# Patient Record
Sex: Female | Born: 1950 | Race: White | Hispanic: No | Smoking: Never smoker
Health system: Southern US, Community
[De-identification: ages and names within clinical notes are randomized; demographics above are authoritative.]

## PROBLEM LIST (undated history)

## (undated) DIAGNOSIS — E7800 Pure hypercholesterolemia, unspecified: Secondary | ICD-10-CM

## (undated) DIAGNOSIS — K219 Gastro-esophageal reflux disease without esophagitis: Secondary | ICD-10-CM

## (undated) DIAGNOSIS — I10 Essential (primary) hypertension: Secondary | ICD-10-CM

## (undated) DIAGNOSIS — B977 Papillomavirus as the cause of diseases classified elsewhere: Secondary | ICD-10-CM

## (undated) DIAGNOSIS — M1990 Unspecified osteoarthritis, unspecified site: Secondary | ICD-10-CM

## (undated) HISTORY — PX: BREAST SURGERY: SHX581

## (undated) HISTORY — PX: TUBAL LIGATION: SHX77

## (undated) HISTORY — PX: BREAST EXCISIONAL BIOPSY: SUR124

## (undated) HISTORY — PX: CERVICAL BIOPSY  W/ LOOP ELECTRODE EXCISION: SUR135

## (undated) HISTORY — PX: ABDOMINAL HYSTERECTOMY: SHX81

---

## 1998-05-14 ENCOUNTER — Emergency Department (HOSPITAL_COMMUNITY): Admission: EM | Admit: 1998-05-14 | Discharge: 1998-05-14 | Payer: Self-pay | Admitting: Emergency Medicine

## 1998-05-14 ENCOUNTER — Encounter: Payer: Self-pay | Admitting: Emergency Medicine

## 1999-11-10 ENCOUNTER — Other Ambulatory Visit: Admission: RE | Admit: 1999-11-10 | Discharge: 1999-11-10 | Payer: Self-pay | Admitting: Family Medicine

## 2000-11-15 ENCOUNTER — Other Ambulatory Visit: Admission: RE | Admit: 2000-11-15 | Discharge: 2000-11-15 | Payer: Self-pay | Admitting: Family Medicine

## 2002-06-03 ENCOUNTER — Other Ambulatory Visit: Admission: RE | Admit: 2002-06-03 | Discharge: 2002-06-03 | Payer: Self-pay | Admitting: Family Medicine

## 2003-05-28 ENCOUNTER — Other Ambulatory Visit: Admission: RE | Admit: 2003-05-28 | Discharge: 2003-05-28 | Payer: Self-pay | Admitting: Family Medicine

## 2004-06-07 ENCOUNTER — Other Ambulatory Visit: Admission: RE | Admit: 2004-06-07 | Discharge: 2004-06-07 | Payer: Self-pay | Admitting: Family Medicine

## 2005-09-26 ENCOUNTER — Other Ambulatory Visit: Admission: RE | Admit: 2005-09-26 | Discharge: 2005-09-26 | Payer: Self-pay | Admitting: Family Medicine

## 2006-10-09 ENCOUNTER — Other Ambulatory Visit: Admission: RE | Admit: 2006-10-09 | Discharge: 2006-10-09 | Payer: Self-pay | Admitting: Family Medicine

## 2006-10-23 ENCOUNTER — Emergency Department (HOSPITAL_COMMUNITY): Admission: EM | Admit: 2006-10-23 | Discharge: 2006-10-23 | Payer: Self-pay | Admitting: Emergency Medicine

## 2006-10-30 ENCOUNTER — Other Ambulatory Visit: Admission: RE | Admit: 2006-10-30 | Discharge: 2006-10-30 | Payer: Self-pay | Admitting: Obstetrics and Gynecology

## 2006-12-19 ENCOUNTER — Ambulatory Visit (HOSPITAL_COMMUNITY): Admission: RE | Admit: 2006-12-19 | Discharge: 2006-12-19 | Payer: Self-pay | Admitting: Obstetrics and Gynecology

## 2006-12-19 ENCOUNTER — Encounter (INDEPENDENT_AMBULATORY_CARE_PROVIDER_SITE_OTHER): Payer: Self-pay | Admitting: Obstetrics and Gynecology

## 2007-07-04 ENCOUNTER — Other Ambulatory Visit: Admission: RE | Admit: 2007-07-04 | Discharge: 2007-07-04 | Payer: Self-pay | Admitting: Obstetrics and Gynecology

## 2008-01-29 ENCOUNTER — Other Ambulatory Visit: Admission: RE | Admit: 2008-01-29 | Discharge: 2008-01-29 | Payer: Self-pay | Admitting: Obstetrics and Gynecology

## 2008-08-04 ENCOUNTER — Other Ambulatory Visit: Admission: RE | Admit: 2008-08-04 | Discharge: 2008-08-04 | Payer: Self-pay | Admitting: Obstetrics and Gynecology

## 2008-12-01 ENCOUNTER — Encounter: Admission: RE | Admit: 2008-12-01 | Discharge: 2008-12-01 | Payer: Self-pay | Admitting: Family Medicine

## 2009-02-15 ENCOUNTER — Other Ambulatory Visit: Admission: RE | Admit: 2009-02-15 | Discharge: 2009-02-15 | Payer: Self-pay | Admitting: Obstetrics and Gynecology

## 2009-08-16 ENCOUNTER — Other Ambulatory Visit: Admission: RE | Admit: 2009-08-16 | Discharge: 2009-08-16 | Payer: Self-pay | Admitting: Obstetrics and Gynecology

## 2010-06-04 ENCOUNTER — Encounter: Payer: Self-pay | Admitting: Obstetrics and Gynecology

## 2010-09-26 NOTE — Op Note (Signed)
NAME:  Kayla Bridges, VARBLE NO.:  [id number]   MEDICAL RECORD NO.:  [id number]          PATIENT TYPE:  AMB   LOCATION:  SDC                           FACILITY:  [facility]   PHYSICIAN:  Charles A. Delcambre, MDDATE OF BIRTH:  1951/01/18   DATE OF PROCEDURE:  12/19/2006  DATE OF DISCHARGE:                               OPERATIVE REPORT   PREOPERATIVE DIAGNOSIS:  1. CIN2 of the cervix.  2. Wide vaginal lesion extension that I feel is less than CIN2.   POSTOPERATIVE DIAGNOSIS:  1. CIN2 of the cervix.  2. Wide vaginal lesion extension that I feel is less than CIN2.   PROCEDURE:  1. LEEP resection of 5 o'clock lesion.  2. Laser conization and laser ablation of wide vaginal extensions of      the lesion as seen with Lugol's and the colposcope.   SURGEON:  Charles A. Delcambre, MD   ASSISTANT:  None.   COMPLICATIONS:  None.   ESTIMATED BLOOD LOSS:  None.   ANESTHESIA:  General by the endotracheal route.   SPECIMENS:  5 o'clock LEEP lesion to pathology.   DESCRIPTION OF PROCEDURE:  The patient was taken to the operating room  and placed in the supine position.  General anesthetic was induced  difficulty.  She was then placed in the dorsal lithotomy position and  prepped with moist towels. The colposcope was used.  Lugol's solution  was placed.  I could see, once again, the wide dilation as well as the  earlier 5 o'clock lesion as expected. LEEP was used on 30 cutting, 50  coag, with a medium sized loop, to excise the lesion at 5 o'clock.  Hemostasis was excellent.  The laser was then used to outline the  cervical lesions and the vaginal extensions superiorly and lateral to  the left as well as lateral to her right. These areas were all ablated  to a depth of 2-3 mm with the laser without damage to surrounding  tissue.  The procedure was then terminated.     Charles A. Sydnee Cabal, MD  Electronically Signed    CAD/MEDQ  D:  12/19/2006  T:  12/19/2006  Job:  [id number]

## 2010-09-26 NOTE — H&P (Signed)
NAME:  Kayla Bridges, Kayla Bridges NO.:  [id number]   MEDICAL RECORD NO.:  [id number]          PATIENT TYPE:  AMB   LOCATION:  SDC                           [facility]   PHYSICIAN:  Charles A. Delcambre, MDDATE OF BIRTH:  04/14/1951   DATE OF ADMISSION:  DATE OF DISCHARGE:                              HISTORY & PHYSICAL   This patient is a 60 year old para 1 to be admitted in early August, I  believe August 7th, to undergo laser LEEP to the cervix, specific lesion  at 5 o'clock, and extensive laser to the vagina and cervix otherwise for  a very wide lesion seen on colposcopy.  She has had ongoing Paps,  difficulties.  Last Pap Oct 09, 2006, showed a low grade dysplasia.  Cervical biopsies consistent with what I have stated above, a CIN-2 at  11 o'clock and then a very wide lesion seen on colposcopy; see diagram.   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   PAST MEDICAL HISTORY:  Hypertension.   ALLERGIES:  PRINIVIL CAUSES EDEMA.   SURGERIES:  1. Tubal ligation, 1990.  2. Removal of breast cyst, 1998.  3. Colonoscopy, June, 2005.   MEDICATIONS:  Avapro, hydrochlorothiazide, and baby aspirin daily, doses  not specified.   PCP:  Dr. Volney Presser at [place].   SOCIAL HISTORY:  1. No tobacco, ethanol or drug use.  2. Not married.  3. In a monogamous relationship with her friend, she states.   PAST HISTORY:  Gonorrhea from her ex-husband in years past, 2005 or  2006, leading to divorce.   FAMILY HISTORY:  1. Father hypertension, heart disease and diabetes.  2. Mother arthritis, vasculitis and diabetes.   REVIEW OF SYSTEMS:  No chest pain, shortness of breath, wheezing,  headaches, abdominal pain, incontinence, bowel or bladder changes  otherwise.   PHYSICAL EXAM:  Alert and oriented x3, weight 141, blood pressure  122/74, heart rate 72, respirations 18.  HEENT EXAM:  Grossly within normal limits.  NECK:  Supple without thyromegaly or  adenopathy.  LUNGS:  Clear bilaterally.  HEART:  Regular rate and rhythm without murmur, rub or gallop.  BREASTS:  Deferred to PCP, symmetrical.  ABDOMEN:  Soft, nontender, nondistended, no hepatosplenomegaly.  PELVIC EXAM:  Normal external female genitalia.  Bartholin, urethral,  Skene scan normal.  Vault without discharge or lesions.  See the diagram  for colposcopy of the lesion.  BIMANUAL EXAMINATION:  Uterus not enlarged, adnexa nontender, ovaries  not palpable bilaterally.   ASSESSMENT:  High grade dysplasia of the cervix with a very wide vaginal  lesion.   PLAN:  Colposcopic-guided LEEP at 5 o'clock, where the high grade biopsy  was, and laser and probably further biopsies.  Depending on colposcopy  at the time of surgery, laser to the remaining cervix and to the wider  lesions on the vagina.  She gives informed consent, accepts risks of  damage to bowel and bladder, understands there will be discharge after  surgery for several weeks and we will get a CBC and a CMET  preoperatively.  All questions were  answered and will proceed as  outlined.      Charles A. Sydnee Cabal, MD  Electronically Signed     CAD/MEDQ  D:  12/03/2006  T:  12/03/2006  Job:  [id number]

## 2011-02-26 LAB — COMPREHENSIVE METABOLIC PANEL
ALT: 17
Albumin: 4.3
BUN: 14
Calcium: 9.7
Glucose, Bld: 103 — ABNORMAL HIGH
Sodium: 137
Total Protein: 7.3

## 2011-02-26 LAB — CBC
Hemoglobin: 13.8
MCHC: 34.7
Platelets: 312
RDW: 12.6

## 2011-03-01 LAB — CBC
HCT: 39.1
MCV: 88.3
RBC: 4.43
WBC: 11.4 — ABNORMAL HIGH

## 2011-03-01 LAB — COMPREHENSIVE METABOLIC PANEL
ALT: 25
AST: 36
Alkaline Phosphatase: 96
CO2: 24
Chloride: 97
GFR calc Af Amer: 45 — ABNORMAL LOW
GFR calc non Af Amer: 37 — ABNORMAL LOW
Glucose, Bld: 158 — ABNORMAL HIGH
Potassium: 3.8
Sodium: 131 — ABNORMAL LOW

## 2011-03-01 LAB — URINALYSIS, ROUTINE W REFLEX MICROSCOPIC
Bilirubin Urine: NEGATIVE
Glucose, UA: NEGATIVE
Ketones, ur: NEGATIVE
Protein, ur: NEGATIVE
pH: 6

## 2011-03-01 LAB — DIFFERENTIAL
Basophils Relative: 0
Eosinophils Absolute: 0
Eosinophils Relative: 0
Lymphs Abs: 1.2
Neutrophils Relative %: 80 — ABNORMAL HIGH

## 2011-03-01 LAB — URINE CULTURE: Colony Count: 5000

## 2011-03-01 LAB — URINE MICROSCOPIC-ADD ON

## 2011-03-01 LAB — LIPASE, BLOOD: Lipase: 45

## 2011-07-11 ENCOUNTER — Other Ambulatory Visit (HOSPITAL_COMMUNITY)
Admission: RE | Admit: 2011-07-11 | Discharge: 2011-07-11 | Disposition: A | Discharge status: Home / Self Care | Payer: 59 | Source: Ambulatory Visit | Attending: Family Medicine | Admitting: Family Medicine

## 2011-07-11 ENCOUNTER — Other Ambulatory Visit: Payer: Self-pay | Admitting: Family Medicine

## 2011-07-11 DIAGNOSIS — Z124 Encounter for screening for malignant neoplasm of cervix: Secondary | ICD-10-CM | POA: Insufficient documentation

## 2012-07-16 ENCOUNTER — Other Ambulatory Visit: Payer: Self-pay | Admitting: Family Medicine

## 2012-07-16 ENCOUNTER — Other Ambulatory Visit (HOSPITAL_COMMUNITY)
Admission: RE | Admit: 2012-07-16 | Discharge: 2012-07-16 | Disposition: A | Discharge status: Home / Self Care | Payer: 59 | Source: Ambulatory Visit | Attending: Family Medicine | Admitting: Family Medicine

## 2012-07-16 DIAGNOSIS — Z01419 Encounter for gynecological examination (general) (routine) without abnormal findings: Secondary | ICD-10-CM | POA: Insufficient documentation

## 2014-03-26 ENCOUNTER — Other Ambulatory Visit: Payer: Self-pay | Admitting: Gastroenterology

## 2014-08-09 ENCOUNTER — Encounter (HOSPITAL_COMMUNITY): Payer: Self-pay

## 2014-08-09 NOTE — Patient Instructions (Addendum)
22 Kayla Bridges  08/09/2014   Your procedure is scheduled on:   08-17-2014 Tuesday  Enter through [facility]  Entrance and follow signs to [facility]. Arrive at    1230    PM.  Call this number if you have problems the morning of surgery: [phone]  Or Presurgical Testing [phone].   For Living Will and/or Health Care Power Attorney Forms: please provide copy for your medical record,may bring AM of surgery(Forms should be already notarized -we do not provide this service).(08-10-14  No information preferred today).     Do not eat food/ or drink: After Midnight.  Exception: may have clear liquids:up to 6 Hours before arrival. Nothing after: 0930 AM  Clear liquids include soda, tea, black coffee, apple or grape juice, broth.  Take these medicines the morning of surgery with A SIP OF WATER: Pantoprazole(Protonix).   Do not wear jewelry, make-up or nail polish.  Do not wear deodorant, lotions, powders, or perfumes.   Do not shave legs and under arms- 48 hours(2 days) prior to first CHG shower.(Shaving face and neck okay.)  Do not bring valuables to the hospital.(Hospital is not responsible for lost valuables).  Contacts, dentures or removable bridgework, body piercing, hair pins may not be worn into surgery.  Leave suitcase in the car. After surgery it may be brought to your room.  For patients admitted to the hospital, checkout time is 11:00 AM the day of discharge.(Restricted visitors-Any Persons displaying flu-like symptoms or illness).    Patients discharged the day of surgery will not be allowed to drive home. Must have responsible person with you x 24 hours once discharged.  Name and phone number of your driver: Claud Kelp [phone] cell     Please read over the following fact sheets that you were given:  CHG(Chlorhexidine Gluconate 4% Surgical Soap) use, MRSA Information, Blood Transfusion fact sheet, Incentive Spirometry Instruction.  Remember :  Type/Screen "Blue armbands" - may not be removed once applied(would result in being retested AM of surgery, if removed).         [place] [facility] - Preparing for Surgery Before surgery, you can play an important role.  Because skin is not sterile, your skin needs to be as free of germs as possible.  You can reduce the number of germs on your skin by washing with CHG (chlorahexidine gluconate) soap before surgery.  CHG is an antiseptic cleaner which kills germs and bonds with the skin to continue killing germs even after washing. Please DO NOT use if you have an allergy to CHG or antibacterial soaps.  If your skin becomes reddened/irritated stop using the CHG and inform your nurse when you arrive at Short Stay. Do not shave (including legs and underarms) for at least 48 hours prior to the first CHG shower.  You may shave your face/neck. Please follow these instructions carefully:  1.  Shower with CHG Soap the night before surgery and the  morning of Surgery.  2.  If you choose to wash your hair, wash your hair first as usual with your  normal  shampoo.  3.  After you shampoo, rinse your hair and body thoroughly to remove the  shampoo.                           4.  Use CHG as you would any other liquid soap.  You can apply chg directly  to the skin and wash  Gently with a scrungie or clean washcloth.  5.  Apply the CHG Soap to your body ONLY FROM THE NECK DOWN.   Do not use on face/ open                           Wound or open sores. Avoid contact with eyes, ears mouth and genitals (private parts).                       Wash face,  Genitals (private parts) with your normal soap.             6.  Wash thoroughly, paying special attention to the area where your surgery  will be performed.  7.  Thoroughly rinse your body with warm water from the neck down.  8.  DO NOT shower/wash with your normal soap after using and rinsing off  the CHG Soap.                9.  Pat yourself dry with a  clean towel.            10.  Wear clean pajamas.            11.  Place clean sheets on your bed the night of your first shower and do not  sleep with pets. Day of Surgery : Do not apply any lotions/deodorants the morning of surgery.  Please wear clean clothes to the hospital/surgery center.  FAILURE TO FOLLOW THESE INSTRUCTIONS MAY RESULT IN THE CANCELLATION OF YOUR SURGERY PATIENT SIGNATURE_________________________________  NURSE SIGNATURE__________________________________  ________________________________________________________________________   Rogelia MireIncentive Spirometer  An incentive spirometer is a tool that can help keep your lungs clear and active. This tool measures how well you are filling your lungs with each breath. Taking long deep breaths may help reverse or decrease the chance of developing breathing (pulmonary) problems (especially infection) following:  A long period of time when you are unable to move or be active. BEFORE THE PROCEDURE   If the spirometer includes an indicator to show your best effort, your nurse or respiratory therapist will set it to a desired goal.  If possible, sit up straight or lean slightly forward. Try not to slouch.  Hold the incentive spirometer in an upright position. INSTRUCTIONS FOR USE   Sit on the edge of your bed if possible, or sit up as far as you can in bed or on a chair.  Hold the incentive spirometer in an upright position.  Breathe out normally.  Place the mouthpiece in your mouth and seal your lips tightly around it.  Breathe in slowly and as deeply as possible, raising the piston or the ball toward the top of the column.  Hold your breath for 3-5 seconds or for as long as possible. Allow the piston or ball to fall to the bottom of the column.  Remove the mouthpiece from your mouth and breathe out normally.  Rest for a few seconds and repeat Steps 1 through 7 at least 10 times every 1-2 hours when you are awake. Take your  time and take a few normal breaths between deep breaths.  The spirometer may include an indicator to show your best effort. Use the indicator as a goal to work toward during each repetition.  After each set of 10 deep breaths, practice coughing to be sure your lungs are clear. If you have an incision (the cut made at the time of  surgery), support your incision when coughing by placing a pillow or rolled up towels firmly against it. Once you are able to get out of bed, walk around indoors and cough well. You may stop using the incentive spirometer when instructed by your caregiver.  RISKS AND COMPLICATIONS  Take your time so you do not get dizzy or light-headed.  If you are in pain, you may need to take or ask for pain medication before doing incentive spirometry. It is harder to take a deep breath if you are having pain. AFTER USE  Rest and breathe slowly and easily.  It can be helpful to keep track of a log of your progress. Your caregiver can provide you with a simple table to help with this. If you are using the spirometer at home, follow these instructions: [place] IF:   You are having difficultly using the spirometer.  You have trouble using the spirometer as often as instructed.  Your pain medication is not giving enough relief while using the spirometer.  You develop fever of 100.5 F (38.1 C) or higher. SEEK IMMEDIATE MEDICAL CARE IF:   You cough up bloody sputum that had not been present before.  You develop fever of 102 F (38.9 C) or greater.  You develop worsening pain at or near the incision site. MAKE SURE YOU:   Understand these instructions.  Will watch your condition.  Will get help right away if you are not doing well or get worse. Document Released: 09/10/2006 Document Revised: 07/23/2011 Document Reviewed: 11/11/2006 Exit[employer] Patient Information 2014 [employer],  [place].   ________________________________________________________________________  WHAT IS A BLOOD TRANSFUSION? Blood Transfusion Information  A transfusion is the replacement of blood or some of its parts. Blood is made up of multiple cells which provide different functions.  Red blood cells carry oxygen and are used for blood loss replacement.  White blood cells fight against infection.  Platelets control bleeding.  Plasma helps clot blood.  Other blood products are available for specialized needs, such as hemophilia or other clotting disorders. BEFORE THE TRANSFUSION  Who gives blood for transfusions?   Healthy volunteers who are fully evaluated to make sure their blood is safe. This is blood bank blood. Transfusion therapy is the safest it has ever been in the practice of medicine. Before blood is taken from a donor, a complete history is taken to make sure that person has no history of diseases nor engages in risky social behavior (examples are intravenous drug use or sexual activity with multiple partners). The donor's travel history is screened to minimize risk of transmitting infections, such as malaria. The donated blood is tested for signs of infectious diseases, such as HIV and hepatitis. The blood is then tested to be sure it is compatible with you in order to minimize the chance of a transfusion reaction. If you or a relative donates blood, this is often done in anticipation of surgery and is not appropriate for emergency situations. It takes many days to process the donated blood. RISKS AND COMPLICATIONS Although transfusion therapy is very safe and saves many lives, the main dangers of transfusion include:   Getting an infectious disease.  Developing a transfusion reaction. This is an allergic reaction to something in the blood you were given. Every precaution is taken to prevent this. The decision to have a blood transfusion has been considered carefully by your caregiver  before blood is given. Blood is not given unless the benefits outweigh the risks. AFTER THE  TRANSFUSION  Right after receiving a blood transfusion, you will usually feel much better and more energetic. This is especially true if your red blood cells have gotten low (anemic). The transfusion raises the level of the red blood cells which carry oxygen, and this usually causes an energy increase.  The nurse administering the transfusion will monitor you carefully for complications. HOME CARE INSTRUCTIONS  No special instructions are needed after a transfusion. You may find your energy is better. Speak with your caregiver about any limitations on activity for underlying diseases you may have. SEEK MEDICAL CARE IF:   Your condition is not improving after your transfusion.  You develop redness or irritation at the intravenous (IV) site. SEEK IMMEDIATE MEDICAL CARE IF:  Any of the following symptoms occur over the next 12 hours:  Shaking chills.  You have a temperature by mouth above 102 F (38.9 C), not controlled by medicine.  Chest, back, or muscle pain.  People around you feel you are not acting correctly or are confused.  Shortness of breath or difficulty breathing.  Dizziness and fainting.  You get a rash or develop hives.  You have a decrease in urine output.  Your urine turns a dark color or changes to pink, red, or brown. Any of the following symptoms occur over the next 10 days:  You have a temperature by mouth above 102 F (38.9 C), not controlled by medicine.  Shortness of breath.  Weakness after normal activity.  The white part of the eye turns yellow (jaundice).  You have a decrease in the amount of urine or are urinating less often.  Your urine turns a dark color or changes to pink, red, or brown. Document Released: 04/27/2000 Document Revised: 07/23/2011 Document Reviewed: 12/15/2007 [facility] Patient Information 2014 [place],  [place].  _______________________________________________________________________

## 2014-08-10 ENCOUNTER — Encounter (HOSPITAL_COMMUNITY)
Admission: RE | Admit: 2014-08-10 | Discharge: 2014-08-10 | Disposition: A | Discharge status: Home / Self Care | Payer: 59 | Source: Ambulatory Visit | Attending: Orthopedic Surgery | Admitting: Orthopedic Surgery

## 2014-08-10 ENCOUNTER — Inpatient Hospital Stay (HOSPITAL_COMMUNITY): Admission: RE | Admit: 2014-08-10 | Payer: Self-pay | Source: Ambulatory Visit

## 2014-08-10 ENCOUNTER — Encounter (HOSPITAL_COMMUNITY): Payer: Self-pay

## 2014-08-10 DIAGNOSIS — M1612 Unilateral primary osteoarthritis, left hip: Secondary | ICD-10-CM | POA: Diagnosis not present

## 2014-08-10 DIAGNOSIS — Z01812 Encounter for preprocedural laboratory examination: Secondary | ICD-10-CM | POA: Insufficient documentation

## 2014-08-10 HISTORY — DX: Gastro-esophageal reflux disease without esophagitis: K21.9

## 2014-08-10 HISTORY — DX: Essential (primary) hypertension: I10

## 2014-08-10 HISTORY — DX: Papillomavirus as the cause of diseases classified elsewhere: B97.7

## 2014-08-10 HISTORY — DX: Pure hypercholesterolemia, unspecified: E78.00

## 2014-08-10 HISTORY — DX: Unspecified osteoarthritis, unspecified site: M19.90

## 2014-08-10 LAB — URINALYSIS, ROUTINE W REFLEX MICROSCOPIC
Bilirubin Urine: NEGATIVE
Glucose, UA: NEGATIVE mg/dL
Hgb urine dipstick: NEGATIVE
KETONES UR: NEGATIVE mg/dL
Leukocytes, UA: NEGATIVE
Nitrite: NEGATIVE
PH: 6 (ref 5.0–8.0)
Protein, ur: NEGATIVE mg/dL
Specific Gravity, Urine: 1.007 (ref 1.005–1.030)
Urobilinogen, UA: 0.2 mg/dL (ref 0.0–1.0)

## 2014-08-10 LAB — CBC
HCT: 40.7 % (ref 36.0–46.0)
Hemoglobin: 13.6 g/dL (ref 12.0–15.0)
MCH: 30.6 pg (ref 26.0–34.0)
MCHC: 33.4 g/dL (ref 30.0–36.0)
MCV: 91.7 fL (ref 78.0–100.0)
Platelets: 279 10*3/uL (ref 150–400)
RBC: 4.44 MIL/uL (ref 3.87–5.11)
RDW: 12 % (ref 11.5–15.5)
WBC: 7.2 10*3/uL (ref 4.0–10.5)

## 2014-08-10 LAB — ABO/RH: ABO/RH(D): B POS

## 2014-08-10 LAB — APTT: aPTT: 33 seconds (ref 24–37)

## 2014-08-10 LAB — PROTIME-INR
INR: 0.98 (ref 0.00–1.49)
PROTHROMBIN TIME: 13.1 s (ref 11.6–15.2)

## 2014-08-10 LAB — SURGICAL PCR SCREEN
MRSA, PCR: NEGATIVE
STAPHYLOCOCCUS AUREUS: NEGATIVE

## 2014-08-10 NOTE — Pre-Procedure Instructions (Signed)
08-10-14 EKG 6'15, Labs- CMP, HGB A1C, LP reports with chart. Clearance note( Dr. Hyman HopesWebb) 07-29-14 with chart.

## 2014-08-16 NOTE — H&P (Signed)
TOTAL HIP ADMISSION H&P  Patient is admitted for left total hip arthroplasty.  Subjective:  Chief Complaint:    Left hip primary OA / pain  HPI: Kayla Bridges, 64 y.o. female, has a history of pain and functional disability in the left hip(s) due to arthritis and patient has failed non-surgical conservative treatments for greater than 12 weeks to include NSAID's and/or analgesics and activity modification.  Onset of symptoms was gradual starting ~1 years ago with gradually worsening course since that time.The patient noted no past surgery on the left hip(s).  Patient currently rates pain in the left hip at 6 out of 10 with activity. Patient has worsening of pain with activity and weight bearing, trendelenberg gait, pain that interfers with activities of daily living and pain with passive range of motion. Patient has evidence of periarticular osteophytes and joint space narrowing by imaging studies. This condition presents safety issues increasing the risk of falls. There is no current active infection.  Risks, benefits and expectations were discussed with the patient.  Risks including but not limited to the risk of anesthesia, blood clots, nerve damage, blood vessel damage, failure of the prosthesis, infection and up to and including death.  Patient understand the risks, benefits and expectations and wishes to proceed with surgery.   PCP: Frederich Chick, MD  D/C Plans:      Home with HHPT  Post-op Meds:       No Rx given  Tranexamic Acid:      To be given - IV  Decadron:      Is to be given  FYI:     ASA post-op  Norco post-op    Past Medical History  Diagnosis Date  . Arthritis     osteoarthritis left hip  . Hypertension   . Elevated cholesterol   . HPV in female   . GERD (gastroesophageal reflux disease)     Past Surgical History  Procedure Laterality Date  . Tubal ligation    . Breast surgery      lumpectomy-benign  . Cervical biopsy  w/ loop electrode excision    .  Abdominal hysterectomy      No prescriptions prior to admission   Allergies  Allergen Reactions  . Prinivil [Lisinopril] Swelling and Rash    History  Substance Use Topics  . Smoking status: Never Smoker   . Smokeless tobacco: Not on file  . Alcohol Use: Yes     Comment: wine x2 glasses twice weekly    No family history on file.   Review of Systems  Constitutional: Negative.   HENT: Negative.   Eyes: Negative.   Respiratory: Negative.   Cardiovascular: Negative.   Gastrointestinal: Positive for heartburn and diarrhea.  Genitourinary: Negative.   Musculoskeletal: Positive for joint pain.  Skin: Negative.   Neurological: Negative.   Endo/Heme/Allergies: Negative.   Psychiatric/Behavioral: Negative.     Objective:  Physical Exam  Constitutional: She is oriented to person, place, and time. She appears well-developed and well-nourished.  HENT:  Head: Normocephalic.  Eyes: Pupils are equal, round, and reactive to light.  Neck: Neck supple. No JVD present. No tracheal deviation present. No thyromegaly present.  Cardiovascular: Normal rate, regular rhythm, normal heart sounds and intact distal pulses.   Respiratory: Effort normal and breath sounds normal. No stridor. No respiratory distress. She has no wheezes.  GI: Soft. There is no tenderness. There is no guarding.  Musculoskeletal:       Left hip: She exhibits  decreased range of motion, decreased strength, tenderness and bony tenderness. She exhibits no swelling, no deformity and no laceration.  Lymphadenopathy:    She has no cervical adenopathy.  Neurological: She is alert and oriented to person, place, and time.  Skin: Skin is warm and dry.  Psychiatric: She has a normal mood and affect.      Imaging Review Plain radiographs demonstrate moderate degenerative joint disease of the left hip(s). The bone quality appears to be good for age and reported activity level.  Assessment/Plan:  End stage arthritis, left  hip(s)  The patient history, physical examination, clinical judgement of the provider and imaging studies are consistent with end stage degenerative joint disease of the left hip(s) and total hip arthroplasty is deemed medically necessary. The treatment options including medical management, injection therapy, arthroscopy and arthroplasty were discussed at length. The risks and benefits of total hip arthroplasty were presented and reviewed. The risks due to aseptic loosening, infection, stiffness, dislocation/subluxation,  thromboembolic complications and other imponderables were discussed.  The patient acknowledged the explanation, agreed to proceed with the plan and consent was signed. Patient is being admitted for inpatient treatment for surgery, pain control, PT, OT, prophylactic antibiotics, VTE prophylaxis, progressive ambulation and ADL's and discharge planning.The patient is planning to be discharged home with home health services.     Anastasio AuerbachMatthew S. Corsica Franson   PA-C  08/16/2014, 9:09 AM

## 2014-08-17 ENCOUNTER — Inpatient Hospital Stay (HOSPITAL_COMMUNITY)
Admission: RE | Admit: 2014-08-17 | Discharge: 2014-08-18 | DRG: 470 | Disposition: A | Discharge status: Home Health | Payer: 59 | Source: Ambulatory Visit | Attending: Orthopedic Surgery | Admitting: Orthopedic Surgery

## 2014-08-17 ENCOUNTER — Inpatient Hospital Stay (HOSPITAL_COMMUNITY): Admission: RE | Admit: 2014-08-17 | Payer: 59 | Source: Ambulatory Visit | Admitting: Orthopedic Surgery

## 2014-08-17 ENCOUNTER — Encounter (HOSPITAL_COMMUNITY)
Admission: RE | Disposition: A | Discharge status: Home Health | Payer: Self-pay | Source: Ambulatory Visit | Attending: Orthopedic Surgery

## 2014-08-17 ENCOUNTER — Inpatient Hospital Stay (HOSPITAL_COMMUNITY): Payer: 59 | Admitting: Anesthesiology

## 2014-08-17 ENCOUNTER — Encounter (HOSPITAL_COMMUNITY): Admission: RE | Payer: Self-pay | Source: Ambulatory Visit

## 2014-08-17 ENCOUNTER — Inpatient Hospital Stay (HOSPITAL_COMMUNITY): Payer: 59

## 2014-08-17 ENCOUNTER — Encounter (HOSPITAL_COMMUNITY): Payer: Self-pay | Admitting: Anesthesiology

## 2014-08-17 DIAGNOSIS — E663 Overweight: Secondary | ICD-10-CM | POA: Diagnosis present

## 2014-08-17 DIAGNOSIS — M1612 Unilateral primary osteoarthritis, left hip: Principal | ICD-10-CM | POA: Diagnosis present

## 2014-08-17 DIAGNOSIS — Z6825 Body mass index (BMI) 25.0-25.9, adult: Secondary | ICD-10-CM

## 2014-08-17 DIAGNOSIS — Z96649 Presence of unspecified artificial hip joint: Secondary | ICD-10-CM

## 2014-08-17 DIAGNOSIS — I10 Essential (primary) hypertension: Secondary | ICD-10-CM | POA: Diagnosis present

## 2014-08-17 DIAGNOSIS — Z01812 Encounter for preprocedural laboratory examination: Secondary | ICD-10-CM | POA: Diagnosis not present

## 2014-08-17 DIAGNOSIS — M25552 Pain in left hip: Secondary | ICD-10-CM | POA: Diagnosis present

## 2014-08-17 HISTORY — PX: TOTAL HIP ARTHROPLASTY: SHX124

## 2014-08-17 LAB — TYPE AND SCREEN
ABO/RH(D): B POS
Antibody Screen: NEGATIVE

## 2014-08-17 SURGERY — ARTHROPLASTY, HIP, TOTAL, ANTERIOR APPROACH
Anesthesia: Spinal | Site: Hip | Laterality: Left

## 2014-08-17 MED ORDER — FENTANYL CITRATE 0.05 MG/ML IJ SOLN
INTRAMUSCULAR | Status: DC | PRN
  Administered 2014-08-17 (×2): 50 ug via INTRAVENOUS

## 2014-08-17 MED ORDER — ASPIRIN EC 325 MG PO TBEC
325.0000 mg | DELAYED_RELEASE_TABLET | Freq: Two times a day (BID) | ORAL | Status: DC
  Administered 2014-08-18: 325 mg via ORAL
  Filled 2014-08-17 (×3): qty 1

## 2014-08-17 MED ORDER — SODIUM CHLORIDE 0.9 % IR SOLN
Status: DC | PRN
Start: 2014-08-17 — End: 2014-08-17
  Administered 2014-08-17: 1000 mL

## 2014-08-17 MED ORDER — LACTATED RINGERS IV SOLN
INTRAVENOUS | Status: DC
  Administered 2014-08-17: 16:00:00 via INTRAVENOUS
  Administered 2014-08-17: 1000 mL via INTRAVENOUS

## 2014-08-17 MED ORDER — MAGNESIUM CITRATE PO SOLN: 1.0000 | Freq: Once | ORAL | Status: AC | PRN

## 2014-08-17 MED ORDER — HYDROMORPHONE HCL 1 MG/ML IJ SOLN
INTRAMUSCULAR | Status: AC
  Filled 2014-08-17: qty 1

## 2014-08-17 MED ORDER — BUPIVACAINE HCL (PF) 0.5 % IJ SOLN
INTRAMUSCULAR | Status: DC | PRN
  Administered 2014-08-17: 3 mL

## 2014-08-17 MED ORDER — MENTHOL 3 MG MT LOZG
1.0000 | LOZENGE | OROMUCOSAL | Status: DC | PRN
Start: 2014-08-17 — End: 2014-08-18

## 2014-08-17 MED ORDER — BISACODYL 10 MG RE SUPP
10.0000 mg | Freq: Every day | RECTAL | Status: DC | PRN
Start: 2014-08-17 — End: 2014-08-18

## 2014-08-17 MED ORDER — HYDROMORPHONE HCL 1 MG/ML IJ SOLN
0.2500 mg | INTRAMUSCULAR | Status: DC | PRN
  Administered 2014-08-17: 0.5 mg via INTRAVENOUS

## 2014-08-17 MED ORDER — POTASSIUM CHLORIDE 2 MEQ/ML IV SOLN
100.0000 mL/h | INTRAVENOUS | Status: DC
  Administered 2014-08-17: 100 mL/h via INTRAVENOUS
  Filled 2014-08-17 (×8): qty 1000

## 2014-08-17 MED ORDER — DOCUSATE SODIUM 100 MG PO CAPS
100.0000 mg | ORAL_CAPSULE | Freq: Two times a day (BID) | ORAL | Status: DC
  Administered 2014-08-17 – 2014-08-18 (×2): 100 mg via ORAL

## 2014-08-17 MED ORDER — DEXAMETHASONE SODIUM PHOSPHATE 10 MG/ML IJ SOLN
10.0000 mg | Freq: Once | INTRAMUSCULAR | Status: AC
  Administered 2014-08-18: 10 mg via INTRAVENOUS
  Filled 2014-08-17: qty 1

## 2014-08-17 MED ORDER — ALUM & MAG HYDROXIDE-SIMETH 200-200-20 MG/5ML PO SUSP: 30.0000 mL | ORAL | Status: DC | PRN

## 2014-08-17 MED ORDER — METOCLOPRAMIDE HCL 10 MG PO TABS: 5.0000 mg | ORAL_TABLET | Freq: Three times a day (TID) | ORAL | Status: DC | PRN

## 2014-08-17 MED ORDER — ONDANSETRON HCL 4 MG/2ML IJ SOLN: 4.0000 mg | Freq: Four times a day (QID) | INTRAMUSCULAR | Status: DC | PRN

## 2014-08-17 MED ORDER — CEFAZOLIN SODIUM-DEXTROSE 2-3 GM-% IV SOLR
2.0000 g | INTRAVENOUS | Status: AC
  Administered 2014-08-17: 2 g via INTRAVENOUS

## 2014-08-17 MED ORDER — TRANEXAMIC ACID 100 MG/ML IV SOLN
1000.0000 mg | Freq: Once | INTRAVENOUS | Status: AC
  Administered 2014-08-17: 1000 mg via INTRAVENOUS
  Filled 2014-08-17: qty 10

## 2014-08-17 MED ORDER — PANTOPRAZOLE SODIUM 40 MG PO TBEC
40.0000 mg | DELAYED_RELEASE_TABLET | Freq: Two times a day (BID) | ORAL | Status: DC
  Administered 2014-08-17 – 2014-08-18 (×2): 40 mg via ORAL
  Filled 2014-08-17 (×2): qty 1

## 2014-08-17 MED ORDER — HYDROMORPHONE HCL 1 MG/ML IJ SOLN: 0.5000 mg | INTRAMUSCULAR | Status: DC | PRN

## 2014-08-17 MED ORDER — ONDANSETRON HCL 4 MG/2ML IJ SOLN
INTRAMUSCULAR | Status: DC | PRN
  Administered 2014-08-17: 4 mg via INTRAVENOUS

## 2014-08-17 MED ORDER — DIPHENHYDRAMINE HCL 25 MG PO CAPS
25.0000 mg | ORAL_CAPSULE | Freq: Four times a day (QID) | ORAL | Status: DC | PRN
  Administered 2014-08-18: 25 mg via ORAL
  Filled 2014-08-17: qty 1

## 2014-08-17 MED ORDER — FERROUS SULFATE 325 (65 FE) MG PO TABS
325.0000 mg | ORAL_TABLET | Freq: Three times a day (TID) | ORAL | Status: DC
  Administered 2014-08-18 (×2): 325 mg via ORAL
  Filled 2014-08-17 (×5): qty 1

## 2014-08-17 MED ORDER — PROPOFOL INFUSION 10 MG/ML OPTIME
INTRAVENOUS | Status: DC | PRN
  Administered 2014-08-17: 100 ug/kg/min via INTRAVENOUS

## 2014-08-17 MED ORDER — PHENOL 1.4 % MT LIQD: 1.0000 | OROMUCOSAL | Status: DC | PRN

## 2014-08-17 MED ORDER — DEXAMETHASONE SODIUM PHOSPHATE 10 MG/ML IJ SOLN
INTRAMUSCULAR | Status: DC | PRN
  Administered 2014-08-17: 10 mg via INTRAVENOUS

## 2014-08-17 MED ORDER — CHLORHEXIDINE GLUCONATE 4 % EX LIQD: 60.0000 mL | Freq: Once | CUTANEOUS | Status: DC

## 2014-08-17 MED ORDER — METOCLOPRAMIDE HCL 5 MG/ML IJ SOLN: 5.0000 mg | Freq: Three times a day (TID) | INTRAMUSCULAR | Status: DC | PRN

## 2014-08-17 MED ORDER — METHOCARBAMOL 1000 MG/10ML IJ SOLN
500.0000 mg | Freq: Four times a day (QID) | INTRAMUSCULAR | Status: DC | PRN
  Administered 2014-08-17: 500 mg via INTRAVENOUS
  Filled 2014-08-17 (×2): qty 5

## 2014-08-17 MED ORDER — HYDROCODONE-ACETAMINOPHEN 7.5-325 MG PO TABS
1.0000 | ORAL_TABLET | ORAL | Status: DC
  Administered 2014-08-17: 1 via ORAL
  Administered 2014-08-17 – 2014-08-18 (×3): 2 via ORAL
  Filled 2014-08-17: qty 2
  Filled 2014-08-17: qty 1
  Filled 2014-08-17 (×2): qty 2

## 2014-08-17 MED ORDER — ONDANSETRON HCL 4 MG PO TABS: 4.0000 mg | ORAL_TABLET | Freq: Four times a day (QID) | ORAL | Status: DC | PRN

## 2014-08-17 MED ORDER — CEFAZOLIN SODIUM-DEXTROSE 2-3 GM-% IV SOLR
2.0000 g | Freq: Four times a day (QID) | INTRAVENOUS | Status: AC
  Administered 2014-08-17 – 2014-08-18 (×2): 2 g via INTRAVENOUS
  Filled 2014-08-17 (×2): qty 50

## 2014-08-17 MED ORDER — CELECOXIB 200 MG PO CAPS
200.0000 mg | ORAL_CAPSULE | Freq: Two times a day (BID) | ORAL | Status: DC
  Administered 2014-08-17 – 2014-08-18 (×2): 200 mg via ORAL
  Filled 2014-08-17 (×3): qty 1

## 2014-08-17 MED ORDER — HYDROCHLOROTHIAZIDE 25 MG PO TABS
25.0000 mg | ORAL_TABLET | Freq: Every morning | ORAL | Status: DC
  Administered 2014-08-18: 25 mg via ORAL
  Filled 2014-08-17: qty 1

## 2014-08-17 MED ORDER — PHENYLEPHRINE HCL 10 MG/ML IJ SOLN
INTRAMUSCULAR | Status: DC | PRN
  Administered 2014-08-17 (×3): 80 ug via INTRAVENOUS

## 2014-08-17 MED ORDER — MIDAZOLAM HCL 5 MG/5ML IJ SOLN
INTRAMUSCULAR | Status: DC | PRN
  Administered 2014-08-17: 2 mg via INTRAVENOUS

## 2014-08-17 MED ORDER — IRBESARTAN 150 MG PO TABS
150.0000 mg | ORAL_TABLET | Freq: Every morning | ORAL | Status: DC
  Administered 2014-08-18: 150 mg via ORAL
  Filled 2014-08-17: qty 1

## 2014-08-17 MED ORDER — POLYETHYLENE GLYCOL 3350 17 G PO PACK
17.0000 g | PACK | Freq: Two times a day (BID) | ORAL | Status: DC
  Administered 2014-08-17 – 2014-08-18 (×2): 17 g via ORAL

## 2014-08-17 MED ORDER — METHOCARBAMOL 500 MG PO TABS: 500.0000 mg | ORAL_TABLET | Freq: Four times a day (QID) | ORAL | Status: DC | PRN

## 2014-08-17 MED ORDER — DEXAMETHASONE SODIUM PHOSPHATE 10 MG/ML IJ SOLN: 10.0000 mg | Freq: Once | INTRAMUSCULAR | Status: DC

## 2014-08-17 SURGICAL SUPPLY — 43 items
BAG DECANTER FOR FLEXI CONT (MISCELLANEOUS) IMPLANT
BAG SPEC THK2 15X12 ZIP CLS (MISCELLANEOUS) ×1
BAG ZIPLOCK 12X15 (MISCELLANEOUS) ×2 IMPLANT
CAPT HIP TOTAL 2 ×2 IMPLANT
COVER PERINEAL POST (MISCELLANEOUS) ×2 IMPLANT
DRAPE C-ARM 42X120 X-RAY (DRAPES) ×2 IMPLANT
DRAPE STERI IOBAN 125X83 (DRAPES) ×2 IMPLANT
DRAPE U-SHAPE 47X51 STRL (DRAPES) ×6 IMPLANT
DRSG AQUACEL AG ADV 3.5X10 (GAUZE/BANDAGES/DRESSINGS) ×2 IMPLANT
DURAPREP 26ML APPLICATOR (WOUND CARE) ×2 IMPLANT
ELECT BLADE TIP CTD 4 INCH (ELECTRODE) ×2 IMPLANT
ELECT PENCIL ROCKER SW 15FT (MISCELLANEOUS) ×2 IMPLANT
ELECT REM PT RETURN 15FT ADLT (MISCELLANEOUS) ×2 IMPLANT
ELECT REM PT RETURN 9FT ADLT (ELECTROSURGICAL)
ELECTRODE REM PT RTRN 9FT ADLT (ELECTROSURGICAL) IMPLANT
FACESHIELD WRAPAROUND (MASK) ×8 IMPLANT
GLOVE BIOGEL PI IND STRL 7.5 (GLOVE) ×1 IMPLANT
GLOVE BIOGEL PI IND STRL 8.5 (GLOVE) ×1 IMPLANT
GLOVE BIOGEL PI INDICATOR 7.5 (GLOVE) ×1
GLOVE BIOGEL PI INDICATOR 8.5 (GLOVE) ×1
GLOVE ECLIPSE 8.0 STRL XLNG CF (GLOVE) ×4 IMPLANT
GLOVE ORTHO TXT STRL SZ7.5 (GLOVE) ×2 IMPLANT
GOWN SPEC L3 XXLG W/TWL (GOWN DISPOSABLE) ×2 IMPLANT
GOWN STRL REUS W/TWL LRG LVL3 (GOWN DISPOSABLE) ×2 IMPLANT
HOLDER FOLEY CATH W/STRAP (MISCELLANEOUS) ×2 IMPLANT
KIT BASIN OR (CUSTOM PROCEDURE TRAY) ×2 IMPLANT
LIQUID BAND (GAUZE/BANDAGES/DRESSINGS) ×2 IMPLANT
NDL SAFETY ECLIPSE 18X1.5 (NEEDLE) IMPLANT
NEEDLE HYPO 18GX1.5 SHARP (NEEDLE)
PACK TOTAL JOINT (CUSTOM PROCEDURE TRAY) ×2 IMPLANT
PEN SKIN MARKING BROAD (MISCELLANEOUS) ×2 IMPLANT
SAW OSC TIP CART 19.5X105X1.3 (SAW) ×2 IMPLANT
SUT MNCRL AB 4-0 PS2 18 (SUTURE) ×2 IMPLANT
SUT VIC AB 1 CT1 36 (SUTURE) ×6 IMPLANT
SUT VIC AB 2-0 CT1 27 (SUTURE) ×4
SUT VIC AB 2-0 CT1 TAPERPNT 27 (SUTURE) ×2 IMPLANT
SUT VLOC 180 0 24IN GS25 (SUTURE) ×2 IMPLANT
SYR 50ML LL SCALE MARK (SYRINGE) IMPLANT
TOWEL OR 17X26 10 PK STRL BLUE (TOWEL DISPOSABLE) ×2 IMPLANT
TOWEL OR NON WOVEN STRL DISP B (DISPOSABLE) IMPLANT
TRAY FOLEY CATH 14FRSI W/METER (CATHETERS) ×2 IMPLANT
WATER STERILE IRR 1500ML POUR (IV SOLUTION) ×2 IMPLANT
YANKAUER SUCT BULB TIP 10FT TU (MISCELLANEOUS) ×2 IMPLANT

## 2014-08-17 NOTE — Progress Notes (Signed)
Pt informed me she dropped a can on her toes (Left foot) a few days ago.  Toes are bruised but not swollen.  She is able to wiggle her toes without pain.

## 2014-08-17 NOTE — Op Note (Signed)
NAME:  Kayla Bridges                ACCOUNT NO.: [id number]      MEDICAL RECORD NO.: [id number]      FACILITY[facility]      PHYSICIAN:  Durene RomansLIN,Chawn Spraggins D  DATE OF BIRTH:  12/25/1950     DATE OF PROCEDURE:  08/17/2014                                 OPERATIVE REPORT         PREOPERATIVE DIAGNOSIS: Right  hip osteoarthritis.      POSTOPERATIVE DIAGNOSIS:  Left hip osteoarthritis.      PROCEDURE:  Left total hip replacement through an anterior approach   utilizing DePuy THR system, component size 52mm pinnacle cup, a size 36+4 neutral   Altrex liner, a size 3Hi Tri Lock stem with a 36+1.5 delta ceramic   ball.      SURGEON:  Madlyn FrankelMatthew D. Charlann Boxerlin, M.D.      ASSISTANT:  Lanney GinsMatthew Babish, PA-C      ANESTHESIA:  Spinal.      SPECIMENS:  None.      COMPLICATIONS:  None.      BLOOD LOSS:  150 cc     DRAINS:  None.      INDICATION OF THE PROCEDURE:  Kayla Bridges is a 64 y.o. female who had   presented to office for evaluation of left hip pain.  Radiographs revealed   progressive degenerative changes with bone-on-bone   articulation to the  hip joint.  The patient had painful limited range of   motion significantly affecting their overall quality of life.  The patient was failing to    respond to conservative measures, and at this point was ready   to proceed with more definitive measures.  The patient has noted progressive   degenerative changes in his hip, progressive problems and dysfunction   with regarding the hip prior to surgery.  Consent was obtained for   benefit of pain relief.  Specific risk of infection, DVT, component   failure, dislocation, need for revision surgery, as well discussion of   the anterior versus posterior approach were reviewed.  Consent was   obtained for benefit of anterior pain relief through an anterior   approach.      PROCEDURE IN DETAIL:  The patient was brought to operative theater.   Once adequate anesthesia,  preoperative antibiotics, 2gm of Ancef, 1gm of Tranexamic Acid, and 10mg  of Decadron administered.   The patient was positioned supine on the OSI Hanna table.  Once adequate   padding of boney process was carried out, we had predraped out the hip, and  used fluoroscopy to confirm orientation of the pelvis and position.      The left hip was then prepped and draped from proximal iliac crest to   mid thigh with shower curtain technique.      Time-out was performed identifying the patient, planned procedure, and   extremity.     An incision was then made 2 cm distal and lateral to the   anterior superior iliac spine extending over the orientation of the   tensor fascia lata muscle and sharp dissection was carried down to the   fascia of the muscle and protractor placed in the soft tissues.      The fascia was then incised.  The muscle belly was identified and swept   laterally and retractor placed along the superior neck.  Following   cauterization of the circumflex vessels and removing some pericapsular   fat, a second cobra retractor was placed on the inferior neck.  A third   retractor was placed on the anterior acetabulum after elevating the   anterior rectus.  A L-capsulotomy was along the line of the   superior neck to the trochanteric fossa, then extended proximally and   distally.  Tag sutures were placed and the retractors were then placed   intracapsular.  We then identified the trochanteric fossa and   orientation of my neck cut, confirmed this radiographically   and then made a neck osteotomy with the femur on traction.  The femoral   head was removed without difficulty or complication.  Traction was let   off and retractors were placed posterior and anterior around the   acetabulum.      The labrum and foveal tissue were debrided.  I began reaming with a 47mm   reamer and reamed up to 51mm reamer with good bony bed preparation and a 52mm   cup was chosen.  The final 52mm  Pinnacle cup was then impacted under fluoroscopy  to confirm the depth of penetration and orientation with respect to   abduction.  A screw was placed followed by the hole eliminator.  The final   36+4 neutral Altrex liner was impacted with good visualized rim fit.  The cup was positioned anatomically within the acetabular portion of the pelvis.      At this point, the femur was rolled at 80 degrees.  Further capsule was   released off the inferior aspect of the femoral neck.  I then   released the superior capsule proximally.  The hook was placed laterally   along the femur and elevated manually and held in position with the bed   hook.  The leg was then extended and adducted with the leg rolled to 100   degrees of external rotation.  Once the proximal femur was fully   exposed, I used a box osteotome to set orientation.  I then began   broaching with the starting chili pepper broach and passed this by hand and then broached up to 3.  With the 3 broach in place I chose a high offset neck and did a trial reduction.  The offset was appropriate, leg lengths   appeared to be equal, confirmed radiographically.   Given these findings, I went ahead and dislocated the hip, repositioned all   retractors and positioned the right hip in the extended and abducted position.  The final 3 Hi Tri Lock stem was   chosen and it was impacted down to the level of neck cut.  Based on this   and the trial reduction, a 36+1.5 delta ceramic ball was chosen and   impacted onto a clean and dry trunnion, and the hip was reduced.  The   hip had been irrigated throughout the case again at this point.  I did   reapproximate the superior capsular leaflet to the anterior leaflet   using #1 Vicryl.  The fascia of the   tensor fascia lata muscle was then reapproximated using #1 Vicryl and #0 V-lock sutures .  The   remaining wound was closed with 2-0 Vicryl and running 4-0 Monocryl.   The hip was cleaned, dried, and dressed  sterilely using Dermabond and   Aquacel dressing.  She was then brought   to recovery room in stable condition tolerating the procedure well.    Lanney Gins, PA-C was present for the entirety of the case involved from   preoperative positioning, perioperative retractor management, general   facilitation of the case, as well as primary wound closure as assistant.            Madlyn Frankel Charlann Boxer, M.D.        08/17/2014 4:09 PM

## 2014-08-17 NOTE — Interval H&P Note (Signed)
History and Physical Interval Note:  08/17/2014 1:35 PM  Kayla Bridges  has presented today for surgery, with the diagnosis of LEFT HIP OA  The various methods of treatment have been discussed with the patient and family. After consideration of risks, benefits and other options for treatment, the patient has consented to  Procedure(s): LEFT TOTAL HIP ARTHROPLASTY ANTERIOR APPROACH (Left) as a surgical intervention .  The patient's history has been reviewed, patient examined, no change in status, stable for surgery.  I have reviewed the patient's chart and labs.  Questions were answered to the patient's satisfaction.     Shelda PalLIN,Willella Harding D

## 2014-08-17 NOTE — Anesthesia Preprocedure Evaluation (Addendum)
Anesthesia Evaluation  Patient identified by MRN, date of birth, ID band Patient awake    Reviewed: Allergy & Precautions, NPO status , Patient's Chart, lab work & pertinent test results  Airway Mallampati: II  TM Distance: >3 FB Neck ROM: Full    Dental no notable dental hx.    Pulmonary neg pulmonary ROS,  breath sounds clear to auscultation  Pulmonary exam normal       Cardiovascular hypertension, Pt. on medications Rhythm:Regular Rate:Normal     Neuro/Psych negative neurological ROS  negative psych ROS   GI/Hepatic Neg liver ROS, GERD-  Medicated,  Endo/Other  negative endocrine ROS  Renal/GU negative Renal ROS  negative genitourinary   Musculoskeletal  (+) Arthritis -,   Abdominal   Peds negative pediatric ROS (+)  Hematology negative hematology ROS (+)   Anesthesia Other Findings   Reproductive/Obstetrics negative OB ROS                           Anesthesia Physical Anesthesia Plan  ASA: II  Anesthesia Plan: Spinal   Post-op Pain Management:    Induction: Intravenous  Airway Management Planned:   Additional Equipment:   Intra-op Plan:   Post-operative Plan:   Informed Consent: I have reviewed the patients History and Physical, chart, labs and discussed the procedure including the risks, benefits and alternatives for the proposed anesthesia with the patient or authorized representative who has indicated his/her understanding and acceptance.   Dental advisory given  Plan Discussed with: CRNA  Anesthesia Plan Comments: (Discussed risks and benefits of and differences between spinal and general. Discussed risks of spinal including headache, backache, failure, bleeding, infection, and nerve damage. Patient consents to spinal. Questions answered. Coagulation studies and platelet count acceptable.)        Anesthesia Quick Evaluation

## 2014-08-17 NOTE — Anesthesia Procedure Notes (Signed)
Spinal Patient location during procedure: OR Start time: 08/17/2014 2:40 PM End time: 08/17/2014 2:44 PM Staffing Anesthesiologist: Franne Grip Resident/CRNA: Lajuana Carry E Performed by: resident/CRNA  Preanesthetic Checklist Completed: patient identified, site marked, surgical consent, pre-op evaluation, timeout performed, IV checked, risks and benefits discussed and monitors and equipment checked Spinal Block Patient position: sitting Prep: Betadine Patient monitoring: heart rate, continuous pulse ox and blood pressure Approach: midline Location: L3-4 Injection technique: single-shot Needle Needle type: Sprotte  Needle gauge: 24 G Needle length: 10 cm Assessment Sensory level: T6 Additional Notes Expiration date of kit checked and confirmed. Patient tolerated procedure well, without complications.

## 2014-08-17 NOTE — Transfer of Care (Signed)
Immediate Anesthesia Transfer of Care Note  Patient: Tonia GhentVickie R Kempfer  Procedure(s) Performed: Procedure(s) (LRB): LEFT TOTAL HIP ARTHROPLASTY ANTERIOR APPROACH (Left)  Patient Location: PACU  Anesthesia Type: Spinal  Level of Consciousness: sedated, patient cooperative and responds to stimulation  Airway & Oxygen Therapy: Patient Spontanous Breathing and Patient connected to face mask oxgen  Post-op Assessment: Report given to PACU RN and Post -op Vital signs reviewed and stable  Post vital signs: Reviewed and stable  Complications: No apparent anesthesia complications

## 2014-08-17 NOTE — Anesthesia Postprocedure Evaluation (Signed)
  Anesthesia Post-op Note  Patient: Kayla Bridges  Procedure(s) Performed: Procedure(s) (LRB): LEFT TOTAL HIP ARTHROPLASTY ANTERIOR APPROACH (Left)  Patient Location: PACU  Anesthesia Type: Spinal  Level of Consciousness: awake and alert   Airway and Oxygen Therapy: Patient Spontanous Breathing  Post-op Pain: mild  Post-op Assessment: Post-op Vital signs reviewed, Patient's Cardiovascular Status Stable, Respiratory Function Stable, Patent Airway and No signs of Nausea or vomiting  Last Vitals:  Filed Vitals:   08/17/14 1808  BP:   Pulse: 72  Temp:   Resp: 17    Post-op Vital Signs: stable   Complications: No apparent anesthesia complications

## 2014-08-18 ENCOUNTER — Encounter (HOSPITAL_COMMUNITY): Payer: Self-pay | Admitting: Orthopedic Surgery

## 2014-08-18 DIAGNOSIS — E663 Overweight: Secondary | ICD-10-CM | POA: Diagnosis present

## 2014-08-18 LAB — BASIC METABOLIC PANEL
Anion gap: 9 (ref 5–15)
BUN: 16 mg/dL (ref 6–23)
CO2: 25 mmol/L (ref 19–32)
CREATININE: 0.82 mg/dL (ref 0.50–1.10)
Calcium: 8.4 mg/dL (ref 8.4–10.5)
Chloride: 103 mmol/L (ref 96–112)
GFR calc Af Amer: 87 mL/min — ABNORMAL LOW (ref >90)
GFR calc non Af Amer: 75 mL/min — ABNORMAL LOW (ref >90)
Glucose, Bld: 156 mg/dL — ABNORMAL HIGH (ref 70–99)
POTASSIUM: 3.6 mmol/L (ref 3.5–5.1)
SODIUM: 137 mmol/L (ref 135–145)

## 2014-08-18 LAB — CBC
HCT: 32.1 % — ABNORMAL LOW (ref 36.0–46.0)
Hemoglobin: 10.9 g/dL — ABNORMAL LOW (ref 12.0–15.0)
MCH: 30.9 pg (ref 26.0–34.0)
MCHC: 34 g/dL (ref 30.0–36.0)
MCV: 90.9 fL (ref 78.0–100.0)
Platelets: 242 10*3/uL (ref 150–400)
RBC: 3.53 MIL/uL — ABNORMAL LOW (ref 3.87–5.11)
RDW: 12 % (ref 11.5–15.5)
WBC: 13.7 10*3/uL — AB (ref 4.0–10.5)

## 2014-08-18 MED ORDER — ASPIRIN 325 MG PO TBEC: 325.0000 mg | DELAYED_RELEASE_TABLET | Freq: Two times a day (BID) | ORAL | Status: AC

## 2014-08-18 MED ORDER — DOCUSATE SODIUM 100 MG PO CAPS: 100.0000 mg | ORAL_CAPSULE | Freq: Two times a day (BID) | ORAL | Status: DC

## 2014-08-18 MED ORDER — FERROUS SULFATE 325 (65 FE) MG PO TABS: 325.0000 mg | ORAL_TABLET | Freq: Three times a day (TID) | ORAL | Status: DC

## 2014-08-18 MED ORDER — HYDROCODONE-ACETAMINOPHEN 7.5-325 MG PO TABS: 1.0000 | ORAL_TABLET | ORAL | Status: DC | PRN

## 2014-08-18 MED ORDER — POLYETHYLENE GLYCOL 3350 17 G PO PACK: 17.0000 g | PACK | Freq: Two times a day (BID) | ORAL | Status: DC

## 2014-08-18 MED ORDER — METHOCARBAMOL 500 MG PO TABS: 500.0000 mg | ORAL_TABLET | Freq: Four times a day (QID) | ORAL | Status: DC | PRN

## 2014-08-18 NOTE — Evaluation (Signed)
Occupational Therapy Evaluation Patient Details Name: GINNY LOOMER MRN: [id number] DOB: 07-09-1950 Today's Date: 08/18/2014    History of Present Illness L THR   Clinical Impression   Pt admitted with THA. Education complete and pt will have A at home. Education complete     Follow Up Recommendations  Home health OT    Equipment Recommendations  None recommended by OT;Other (comment) (pt  has a 3 n 1)    Recommendations for Other Services       Precautions / Restrictions Precautions Precautions: Fall Restrictions Weight Bearing Restrictions: No Other Position/Activity Restrictions: WBAT      Mobility Bed Mobility Overal bed mobility: Needs Assistance Bed Mobility: Supine to Sit     Supine to sit: Min assist     General bed mobility comments: pt up in chair  Transfers Overall transfer level: Needs assistance Equipment used: Rolling walker (2 wheeled) Transfers: Sit to/from Stand Sit to Stand: Min guard         General transfer comment: VC for safety         ADL Overall ADL's : Needs assistance/impaired                 Upper Body Dressing : Set up;Sitting   Lower Body Dressing: Minimal assistance;Sit to/from stand   Toilet Transfer: Minimal assistance;BSC   Toileting- Clothing Manipulation and Hygiene: Minimal assistance;Sit to/from stand                         Pertinent Vitals/Pain Pain Assessment: 0-10 Pain Score: 2  Pain Location: l hip Pain Descriptors / Indicators: Sore Pain Intervention(s): Limited activity within patient's tolerance     Hand Dominance Right   Extremity/Trunk Assessment Upper Extremity Assessment Upper Extremity Assessment: Overall WFL for tasks assessed   Lower Extremity Assessment Lower Extremity Assessment: LLE deficits/detail LLE Deficits / Details: hip strength 2+/5 with AAROM at hip to 95 flex and 20 abd   Cervical / Trunk Assessment Cervical / Trunk Assessment: Normal    Communication Communication Communication: No difficulties   Cognition Arousal/Alertness: Awake/alert Behavior During Therapy: WFL for tasks assessed/performed Overall Cognitive Status: Within Functional Limits for tasks assessed                     General Comments       Exercises Exercises: Total Joint     Shoulder Instructions      Home Living Family/patient expects to be discharged to:: Private residence Living Arrangements: Alone Available Help at Discharge: Family Type of Home: House Home Access: Stairs to enter Secretary/administrator of Steps: 2 Entrance Stairs-Rails: Right Home Layout: One level     Bathroom Shower/Tub: Producer, television/film/video: Standard     Home Equipment: None          Prior Functioning/Environment Level of Independence: Independent                      OT Goals(Current goals can be found in the care plan section) Acute Rehab OT Goals Patient Stated Goal: Resume previous lifestyle with decreased pain  OT Frequency:     Barriers to D/C:               End of Session Equipment Utilized During Treatment: Rolling walker Nurse Communication: Mobility status  Activity Tolerance: Patient tolerated treatment well Patient left: in chair   Time: 1210-1224 OT Time Calculation (min): 14 min Charges:  OT General Charges $OT Visit: 1 Procedure OT Evaluation $Initial OT Evaluation Tier I: 1 Procedure G-Codes:    Alba CoryREDDING, Tayshaun Kroh D 08/18/2014, 12:33 PM

## 2014-08-18 NOTE — Evaluation (Signed)
Physical Therapy Evaluation Patient Details Name: Kayla Bridges MRN: [id number] DOB: 1951/02/27 Today's Date: 08/18/2014   History of Present Illness  L THR  Clinical Impression  Pt s/p L THR presents with decreased L LE strength/ROM and post op pain limiting functional mobility.  Pt should progress to dc home with family assist and HHPT follow up.    Follow Up Recommendations Home health PT    Equipment Recommendations  Rolling walker with 5" wheels    Recommendations for Other Services OT consult     Precautions / Restrictions Precautions Precautions: Fall Restrictions Weight Bearing Restrictions: No Other Position/Activity Restrictions: WBAT      Mobility  Bed Mobility Overal bed mobility: Needs Assistance Bed Mobility: Supine to Sit     Supine to sit: Min assist     General bed mobility comments: cues for sequence and use of R LE to self assist  Transfers Overall transfer level: Needs assistance Equipment used: Rolling walker (2 wheeled) Transfers: Sit to/from Stand Sit to Stand: Min assist         General transfer comment: cues for LE management and use of UEs to self assist  Ambulation/Gait Ambulation/Gait assistance: Min assist Ambulation Distance (Feet): 120 Feet Assistive device: Rolling walker (2 wheeled) Gait Pattern/deviations: Step-to pattern;Step-through pattern;Decreased step length - right;Decreased step length - left;Shuffle;Antalgic;Trunk flexed     General Gait Details: cues for sequence, posture and position from [employer]            Wheelchair Mobility    Modified Rankin (Stroke Patients Only)       Balance                                             Pertinent Vitals/Pain Pain Assessment: 0-10 Pain Score: 3  Pain Location: L hip Pain Descriptors / Indicators: Aching;Sore Pain Intervention(s): Limited activity within patient's tolerance;Monitored during session;Premedicated before session;Ice  applied    Home Living Family/patient expects to be discharged to:: Private residence Living Arrangements: Alone Available Help at Discharge: Family Type of Home: House Home Access: Stairs to enter Entrance Stairs-Rails: Right Entrance Stairs-Number of Steps: 2 Home Layout: One level Home Equipment: None      Prior Function Level of Independence: Independent               Hand Dominance   Dominant Hand: Right    Extremity/Trunk Assessment   Upper Extremity Assessment: Overall WFL for tasks assessed           Lower Extremity Assessment: LLE deficits/detail   LLE Deficits / Details: hip strength 2+/5 with AAROM at hip to 95 flex and 20 abd  Cervical / Trunk Assessment: Normal  Communication   Communication: No difficulties  Cognition Arousal/Alertness: Awake/alert Behavior During Therapy: WFL for tasks assessed/performed Overall Cognitive Status: Within Functional Limits for tasks assessed                      General Comments      Exercises Total Joint Exercises Ankle Circles/Pumps: AROM;Both;15 reps;Supine Quad Sets: AROM;Both;10 reps;Supine Heel Slides: AAROM;Left;15 reps;Supine Hip ABduction/ADduction: AROM;Left;10 reps;Supine      Assessment/Plan    PT Assessment Patient needs continued PT services  PT Diagnosis Difficulty walking   PT Problem List Decreased strength;Decreased range of motion;Decreased activity tolerance;Decreased mobility;Decreased knowledge of use of DME;Pain  PT Treatment Interventions  DME instruction;Gait training;Stair training;Functional mobility training;Therapeutic activities;Therapeutic exercise;Patient/family education   PT Goals (Current goals can be found in the Care Plan section) Acute Rehab PT Goals Patient Stated Goal: Resume previous lifestyle with decreased pain PT Goal Formulation: With patient Time For Goal Achievement: 08/25/14 Potential to Achieve Goals: Good    Frequency 7X/week   Barriers to  discharge        Co-evaluation               End of Session Equipment Utilized During Treatment: Gait belt Activity Tolerance: Patient tolerated treatment well Patient left: in chair;with call bell/phone within reach Nurse Communication: Mobility status         Time: 9811-91470936-1003 PT Time Calculation (min) (ACUTE ONLY): 27 min   Charges:   PT Evaluation $Initial PT Evaluation Tier I: 1 Procedure PT Treatments $Therapeutic Exercise: 8-22 mins   PT G Codes:        Zuma Hust 08/18/2014, 10:31 AM

## 2014-08-18 NOTE — Progress Notes (Signed)
     Subjective: 1 Day Post-Op Procedure(s) (LRB): LEFT TOTAL HIP ARTHROPLASTY ANTERIOR APPROACH (Left)   Patient reports pain as mild, pain controlled. No events throughout the night. Ready to be discharged home if she does well with PT and pain stays controlled.  Objective:   VITALS:   Filed Vitals:   08/18/14 0613  BP: 123/66  Pulse: 68  Temp: 98 F (36.7 C)  Resp: 16    Dorsiflexion/Plantar flexion intact Incision: dressing C/D/I No cellulitis present Compartment soft  LABS  Recent Labs  08/18/14 0420  HGB 10.9*  HCT 32.1*  WBC 13.7*  PLT 242     Recent Labs  08/18/14 0420  NA 137  K 3.6  BUN 16  CREATININE 0.82  GLUCOSE 156*     Assessment/Plan: 1 Day Post-Op Procedure(s) (LRB): LEFT TOTAL HIP ARTHROPLASTY ANTERIOR APPROACH (Left) Foley cath d/c'ed Advance diet Up with therapy D/C IV fluids Discharge home with home health [facility] Follow up with OLIN,Lashai Grosch D in 2 weeks.  Contact information:  [facility] [address], [address] [place] [id number] [phone]    Overweight (BMI 25-29.9) Estimated body mass index is 25.29 kg/(m^2) as calculated from the following:   Height as of this encounter: 5\' 5"  (1.651 m).   Weight as of this encounter: 68.947 kg (152 lb). Patient also counseled that weight may inhibit the healing process Patient counseled that losing weight will help with future health issues     Anastasio AuerbachMatthew S. Aram Domzalski   PAC  08/18/2014, 10:03 AM

## 2014-08-18 NOTE — Care Management Note (Addendum)
    Page 1 of 2   08/18/2014     11:57:50 AM CARE MANAGEMENT NOTE 08/18/2014  Patient:  Kayla Bridges,Kayla Bridges   Account Number:  [id number]  Date Initiated:  08/18/2014  Documentation initiated by:  [facility]  Subjective/Objective Assessment:   adm: LEFT TOTAL HIP ARTHROPLASTY ANTERIOR APPROACH (Left)     Action/Plan:   discharge planning   Anticipated DC Date:  08/18/2014   Anticipated DC Plan:  [place]  CM consult      [facility] Choice  HOME HEALTH   Choice offered to / List presented to:  C-1 Patient   DME arranged  3-N-1  Vassie Moselle      DME agency  [facility] arranged  [place]   Status of service:  Completed, signed off Medicare Important Message given?   (If response is "NO", the following Medicare IM given date fields will be blank) Date Medicare IM given:   Medicare IM given by:   Date Additional Medicare IM given:   Additional Medicare IM given by:    Discharge Disposition:  [place]  Per UR Regulation:  Reviewed for med. necessity/level of care/duration of stay  If discussed at [place] of Stay Meetings, dates discussed:    Comments:  08/18/14 08:45 CM met with pt in room to offer choice of home health agency.  Pt chooses [facility] to render HHPT.  Address and contact information verified by pt.  Referral emailed to [employer], Tim.  CM called [facility] DME rep, Colletta [place] to please deliver 3n1 and rolling walker to room prior to discharg.  No other Cm needs were communicated.  Mariane Masters, BSN, CM [phone].

## 2014-08-18 NOTE — Progress Notes (Signed)
Physical Therapy Treatment Patient Details Name: Kayla Bridges MRN: [id number] DOB: 08/02/1950 Today's Date: 08/18/2014    History of Present Illness L THR    PT Comments    Pt progressing well.  Reviewed stairs and car transfers with pt and sister.  Follow Up Recommendations  Home health PT     Equipment Recommendations  Rolling walker with 5" wheels    Recommendations for Other Services OT consult     Precautions / Restrictions Precautions Precautions: Fall Restrictions Weight Bearing Restrictions: No Other Position/Activity Restrictions: WBAT    Mobility  Bed Mobility                  Transfers Overall transfer level: Needs assistance Equipment used: Rolling walker (2 wheeled) Transfers: Sit to/from Stand Sit to Stand: Supervision         General transfer comment: VC for safety  Ambulation/Gait Ambulation/Gait assistance: Min guard;Supervision Ambulation Distance (Feet): 175 Feet Assistive device: Rolling walker (2 wheeled) Gait Pattern/deviations: Step-to pattern;Step-through pattern;Decreased step length - right;Decreased step length - left;Shuffle;Antalgic;Trunk flexed     General Gait Details: cues for sequence, posture and position from RW   Stairs Stairs: Yes Stairs assistance: Min assist Stair Management: One rail Left;Step to pattern;Forwards Number of Stairs: 4 General stair comments: cues for sequence and foot placement, pt utilizing both hands on single rail  Wheelchair Mobility    Modified Rankin (Stroke Patients Only)       Balance                                    Cognition Arousal/Alertness: Awake/alert Behavior During Therapy: WFL for tasks assessed/performed Overall Cognitive Status: Within Functional Limits for tasks assessed                      Exercises      General Comments        Pertinent Vitals/Pain Pain Assessment: 0-10 Pain Score: 3  Pain Location: L hip Pain  Descriptors / Indicators: Aching;Sore Pain Intervention(s): Limited activity within patient's tolerance;Monitored during session;Premedicated before session;Ice applied    Home Living                      Prior Function            PT Goals (current goals can now be found in the care plan section) Acute Rehab PT Goals Patient Stated Goal: Resume previous lifestyle with decreased pain PT Goal Formulation: With patient Time For Goal Achievement: 08/25/14 Potential to Achieve Goals: Good Progress towards PT goals: Progressing toward goals    Frequency  7X/week    PT Plan Current plan remains appropriate    Co-evaluation             End of Session Equipment Utilized During Treatment: Gait belt Activity Tolerance: Patient tolerated treatment well Patient left: in chair;with call bell/phone within reach     Time: 1300-1323 PT Time Calculation (min) (ACUTE ONLY): 23 min  Charges:  $Gait Training: 8-22 mins $Therapeutic Activity: 8-22 mins                    G Codes:      Kayla Bridges 08/18/2014, 5:03 PM

## 2014-08-26 NOTE — Discharge Summary (Signed)
Physician Discharge Summary  Patient ID: Kayla GhentVickie R Schiller MRN: [id number] DOB/AGE: 62/08/1950 62 y.o.  Admit date: 08/17/2014 Discharge date: 08/18/2014   Procedures:  Procedure(s) (LRB): LEFT TOTAL HIP ARTHROPLASTY ANTERIOR APPROACH (Left)  Attending Physician:  Dr. Durene RomansMatthew Olin   Admission Diagnoses:   Left hip primary OA / pain  Discharge Diagnoses:  Principal Problem:   S/P left THA, AA Active Problems:   Overweight (BMI 25.0-29.9)  Past Medical History  Diagnosis Date  . Arthritis     osteoarthritis left hip  . Hypertension   . Elevated cholesterol   . HPV in female   . GERD (gastroesophageal reflux disease)     HPI:    Kayla Bridges, 64 y.o. female, has a history of pain and functional disability in the left hip(s) due to arthritis and patient has failed non-surgical conservative treatments for greater than 12 weeks to include NSAID's and/or analgesics and activity modification. Onset of symptoms was gradual starting ~1 years ago with gradually worsening course since that time.The patient noted no past surgery on the left hip(s). Patient currently rates pain in the left hip at 6 out of 10 with activity. Patient has worsening of pain with activity and weight bearing, trendelenberg gait, pain that interfers with activities of daily living and pain with passive range of motion. Patient has evidence of periarticular osteophytes and joint space narrowing by imaging studies. This condition presents safety issues increasing the risk of falls.There is no current active infection. Risks, benefits and expectations were discussed with the patient. Risks including but not limited to the risk of anesthesia, blood clots, nerve damage, blood vessel damage, failure of the prosthesis, infection and up to and including death. Patient understand the risks, benefits and expectations and wishes to proceed with surgery.   PCP: Kayla Bridges, Kayla D, MD   Discharged Condition: good  Hospital  Course:  Patient underwent the above stated procedure on 08/17/2014. Patient tolerated the procedure well and brought to the recovery room in good condition and subsequently to the floor.  POD #1 BP: 123/66 ; Pulse: 68 ; Temp: 98 F (36.7 C) ; Resp: 16 Patient reports pain as mild, pain controlled. No events throughout the night. Ready to be discharged home. Dorsiflexion/plantar flexion intact, incision: dressing C/Bridges/I, no cellulitis present and compartment soft.   LABS  Basename    HGB  10.9  HCT  32.1    Discharge Exam: General appearance: alert, cooperative and no distress Extremities: Homans sign is negative, no sign of DVT, no edema, redness or tenderness in the calves or thighs and no ulcers, gangrene or trophic changes  Disposition: Home with follow up in 2 weeks   Follow-up Information    Follow up with Shelda PalLIN,Dannel Rafter D, MD. Schedule an appointment as soon as possible for a visit in 2 weeks.   Specialty:  Orthopedic Surgery   Contact information:   [address] [place] [address] [phone]       [facility]   Why:  home health physical therapy   Contact information:   [address] [place] [address] [phone]       Follow up with [facility].   Why:  rolling walker and 3n1 (commode)   Contact information:   [address] [place] [address] [phone]       Discharge Instructions    Call MD / Call 911    Complete by:  As directed   If you experience chest pain or shortness of breath, CALL 911 and be transported to the hospital emergency room.  If you develope a fever above 101 F, pus (white drainage) or increased drainage or redness at the wound, or calf pain, call your surgeon's office.     Change dressing    Complete by:  As directed   Maintain surgical dressing until follow up in the clinic. If the edges start to pull up, may reinforce with tape. If the dressing is  no longer working, may remove and cover with gauze and tape, but must keep the area dry and clean.  Call with any questions or concerns.     Constipation Prevention    Complete by:  As directed   Drink plenty of fluids.  Prune juice may be helpful.  You may use a stool softener, such as Colace (over the counter) 100 mg twice a day.  Use MiraLax (over the counter) for constipation as needed.     Diet - low sodium heart healthy    Complete by:  As directed      Discharge instructions    Complete by:  As directed   Maintain surgical dressing until follow up in the clinic. If the edges start to pull up, may reinforce with tape. If the dressing is no longer working, may remove and cover with gauze and tape, but must keep the area dry and clean.  Follow up in 2 weeks at [facility]. Call with any questions or concerns.     Increase activity slowly as tolerated    Complete by:  As directed      TED hose    Complete by:  As directed   Use stockings (TED hose) for 2 weeks on both leg(s).  You may remove them at night for sleeping.     Weight bearing as tolerated    Complete by:  As directed   Laterality:  left  Extremity:  Lower             Medication List    TAKE these medications        aspirin 325 MG EC tablet  Take 1 tablet (325 mg total) by mouth 2 (two) times daily.     calcium-vitamin Bridges 500-200 MG-UNIT per tablet  Commonly known as:  OSCAL WITH Bridges  Take 1 tablet by mouth daily with breakfast.     cholecalciferol 1000 UNITS tablet  Commonly known as:  VITAMIN Bridges  Take 1,000 Units by mouth daily.     CINNAMON PO  Take 1 tablet by mouth daily.     docusate sodium 100 MG capsule  Commonly known as:  COLACE  Take 1 capsule (100 mg total) by mouth 2 (two) times daily.     ferrous sulfate 325 (65 FE) MG tablet  Take 1 tablet (325 mg total) by mouth 3 (three) times daily after meals.     glucosamine-chondroitin 500-400 MG tablet  Take 1 tablet by mouth daily.      hydrochlorothiazide 25 MG tablet  Commonly known as:  HYDRODIURIL  Take 25 mg by mouth every morning.     HYDROcodone-acetaminophen 7.5-325 MG per tablet  Commonly known as:  NORCO  Take 1-2 tablets by mouth every 4 (four) hours as needed for moderate pain or severe pain.     irbesartan 300 MG tablet  Commonly known as:  AVAPRO  Take 150 mg by mouth every morning.  methocarbamol 500 MG tablet  Commonly known as:  ROBAXIN  Take 1 tablet (500 mg total) by mouth every 6 (six) hours as needed for muscle spasms.     pantoprazole 40 MG tablet  Commonly known as:  PROTONIX  Take 40 mg by mouth 2 (two) times daily.     polyethylene glycol packet  Commonly known as:  MIRALAX / GLYCOLAX  Take 17 g by mouth 2 (two) times daily.     vitamin C 500 MG tablet  Commonly known as:  ASCORBIC ACID  Take 500 mg by mouth daily.         Signed: Anastasio Auerbach. Guiseppe Flanagan   PA-C  08/26/2014, 6:05 PM

## 2015-09-06 ENCOUNTER — Other Ambulatory Visit: Payer: Self-pay | Admitting: Family Medicine

## 2015-09-06 ENCOUNTER — Other Ambulatory Visit (HOSPITAL_COMMUNITY)
Admission: RE | Admit: 2015-09-06 | Discharge: 2015-09-06 | Disposition: A | Discharge status: Home / Self Care | Payer: 59 | Source: Ambulatory Visit | Attending: Family Medicine | Admitting: Family Medicine

## 2015-09-06 DIAGNOSIS — Z01419 Encounter for gynecological examination (general) (routine) without abnormal findings: Secondary | ICD-10-CM | POA: Diagnosis present

## 2015-09-06 DIAGNOSIS — Z1151 Encounter for screening for human papillomavirus (HPV): Secondary | ICD-10-CM | POA: Diagnosis present

## 2015-09-09 LAB — CYTOLOGY - PAP

## 2018-01-21 ENCOUNTER — Other Ambulatory Visit: Payer: Self-pay | Admitting: Orthopedic Surgery

## 2018-01-21 DIAGNOSIS — M1611 Unilateral primary osteoarthritis, right hip: Secondary | ICD-10-CM

## 2018-01-24 ENCOUNTER — Ambulatory Visit
Admission: RE | Admit: 2018-01-24 | Discharge: 2018-01-24 | Disposition: A | Discharge status: Home / Self Care | Payer: Medicare Other | Source: Ambulatory Visit | Attending: Orthopedic Surgery | Admitting: Orthopedic Surgery

## 2018-01-24 DIAGNOSIS — M1611 Unilateral primary osteoarthritis, right hip: Secondary | ICD-10-CM

## 2018-03-28 ENCOUNTER — Other Ambulatory Visit: Payer: Self-pay | Admitting: Family Medicine

## 2018-03-28 DIAGNOSIS — N644 Mastodynia: Secondary | ICD-10-CM

## 2018-04-02 ENCOUNTER — Other Ambulatory Visit: Payer: Self-pay

## 2018-04-02 ENCOUNTER — Other Ambulatory Visit: Payer: Self-pay | Admitting: Family Medicine

## 2018-04-02 DIAGNOSIS — N644 Mastodynia: Secondary | ICD-10-CM

## 2018-04-04 ENCOUNTER — Other Ambulatory Visit: Payer: Medicare Other

## 2018-04-15 ENCOUNTER — Ambulatory Visit: Payer: Medicare Other

## 2018-04-15 ENCOUNTER — Ambulatory Visit
Admission: RE | Admit: 2018-04-15 | Discharge: 2018-04-15 | Disposition: A | Discharge status: Home / Self Care | Payer: Medicare Other | Source: Ambulatory Visit | Attending: Family Medicine | Admitting: Family Medicine

## 2018-04-15 DIAGNOSIS — N644 Mastodynia: Secondary | ICD-10-CM

## 2018-05-13 ENCOUNTER — Ambulatory Visit: Payer: Self-pay | Admitting: Orthopedic Surgery

## 2018-05-16 ENCOUNTER — Ambulatory Visit: Payer: Self-pay | Admitting: Orthopedic Surgery

## 2018-05-16 NOTE — H&P (View-Only) (Signed)
TOTAL HIP ADMISSION H&P  Patient is admitted for right total hip arthroplasty.  Subjective:  Chief Complaint: right hip pain  HPI: Kayla Bridges, 67 y.o. female, has a history of pain and functional disability in the right hip(s) due to arthritis and patient has failed non-surgical conservative treatments for greater than 12 weeks to include NSAID's and/or analgesics, flexibility and strengthening excercises, use of assistive devices, weight reduction as appropriate and activity modification.  Onset of symptoms was gradual starting 5 years ago with gradually worsening course since that time.The patient noted no past surgery on the right hip(s).  Patient currently rates pain in the right hip at 10 out of 10 with activity. Patient has night pain, worsening of pain with activity and weight bearing, trendelenberg gait, pain that interfers with activities of daily living and pain with passive range of motion. Patient has evidence of subchondral cysts, subchondral sclerosis, periarticular osteophytes and joint space narrowing by imaging studies. This condition presents safety issues increasing the risk of falls. There is no current active infection.  Patient Active Problem List   Diagnosis Date Noted  . Overweight (BMI 25.0-29.9) 08/18/2014  . S/P left THA, AA 08/17/2014   Past Medical History:  Diagnosis Date  . Arthritis    osteoarthritis left hip  . Elevated cholesterol   . GERD (gastroesophageal reflux disease)   . HPV in female   . Hypertension     Past Surgical History:  Procedure Laterality Date  . ABDOMINAL HYSTERECTOMY    . BREAST EXCISIONAL BIOPSY Left   . BREAST SURGERY     lumpectomy-benign  . CERVICAL BIOPSY  W/ LOOP ELECTRODE EXCISION    . TOTAL HIP ARTHROPLASTY Left 08/17/2014   Procedure: LEFT TOTAL HIP ARTHROPLASTY ANTERIOR APPROACH;  Surgeon: Matthew Olin, MD;  Location: [facility];  Service: Orthopedics;  Laterality: Left;  . TUBAL LIGATION      Current Outpatient  Medications  Medication Sig Dispense Refill Last Dose  . aspirin EC 81 MG tablet Take 81 mg by mouth daily.     . CALCIUM CARBONATE-VITAMIN D PO Take 1 tablet by mouth daily with breakfast.    Past Week at Unknown time  . Cholecalciferol (VITAMIN D) 50 MCG (2000 UT) CAPS Take 2,000 Units by mouth daily.    Past Week at Unknown time  . docusate sodium (COLACE) 100 MG capsule Take 1 capsule (100 mg total) by mouth 2 (two) times daily. (Patient not taking: Reported on 05/15/2018) 10 capsule 0 Not Taking at Unknown time  . ferrous sulfate 325 (65 FE) MG tablet Take 1 tablet (325 mg total) by mouth 3 (three) times daily after meals. (Patient not taking: Reported on 05/15/2018)  3 Not Taking at Unknown time  . hydrochlorothiazide (HYDRODIURIL) 25 MG tablet Take 25 mg by mouth every morning.   08/17/2014 at 0730  . HYDROcodone-acetaminophen (NORCO) 7.5-325 MG per tablet Take 1-2 tablets by mouth every 4 (four) hours as needed for moderate pain or severe pain. (Patient not taking: Reported on 05/15/2018) 100 tablet 0 Not Taking at Unknown time  . irbesartan (AVAPRO) 300 MG tablet Take 150 mg by mouth every morning.   08/17/2014 at 0730  . methocarbamol (ROBAXIN) 500 MG tablet Take 1 tablet (500 mg total) by mouth every 6 (six) hours as needed for muscle spasms. (Patient not taking: Reported on 05/15/2018) 50 tablet 0 Not Taking at Unknown time  . omeprazole (PRILOSEC) 40 MG capsule Take 40 mg by mouth daily.     .   polyethylene glycol (MIRALAX / GLYCOLAX) packet Take 17 g by mouth 2 (two) times daily. (Patient not taking: Reported on 05/15/2018) 14 each 0 Not Taking at Unknown time  . TURMERIC PO Take 200 mg by mouth daily.     . vitamin B-12 (CYANOCOBALAMIN) 1000 MCG tablet Take 1,000 mcg by mouth daily.      No current facility-administered medications for this visit.    Allergies  Allergen Reactions  . Prinivil [Lisinopril] Swelling and Rash    Social History   Tobacco Use  . Smoking status: Never Smoker   Substance Use Topics  . Alcohol use: Yes    Comment: wine x2 glasses twice weekly    No family history on file.   ROS  Objective:  Physical Exam  Vital signs in last 24 hours: @VSRANGES @  Labs:   Estimated body mass index is 25.29 kg/m as calculated from the following:   Height as of 08/17/14: 5\' 5"  (1.651 m).   Weight as of 08/17/14: 68.9 kg.   Imaging Review Plain radiographs demonstrate severe degenerative joint disease of the right hip(s). The bone quality appears to be adequate for age and reported activity level.    Preoperative templating of the joint replacement has been completed, documented, and submitted to the Operating Room personnel in order to optimize intra-operative equipment management.     Assessment/Plan:  End stage arthritis, right hip(s)  The patient history, physical examination, clinical judgement of the provider and imaging studies are consistent with end stage degenerative joint disease of the right hip(s) and total hip arthroplasty is deemed medically necessary. The treatment options including medical management, injection therapy, arthroscopy and arthroplasty were discussed at length. The risks and benefits of total hip arthroplasty were presented and reviewed. The risks due to aseptic loosening, infection, stiffness, dislocation/subluxation,  thromboembolic complications and other imponderables were discussed.  The patient acknowledged the explanation, agreed to proceed with the plan and consent was signed. Patient is being admitted for inpatient treatment for surgery, pain control, PT, OT, prophylactic antibiotics, VTE prophylaxis, progressive ambulation and ADL's and discharge planning.The patient is planning to be discharged home with HEP. Has DME.

## 2018-05-16 NOTE — H&P (Signed)
TOTAL HIP ADMISSION H&P  Patient is admitted for right total hip arthroplasty.  Subjective:  Chief Complaint: right hip pain  HPI: Kayla Bridges, 68 y.o. female, has a history of pain and functional disability in the right hip(s) due to arthritis and patient has failed non-surgical conservative treatments for greater than 12 weeks to include NSAID's and/or analgesics, flexibility and strengthening excercises, use of assistive devices, weight reduction as appropriate and activity modification.  Onset of symptoms was gradual starting 5 years ago with gradually worsening course since that time.The patient noted no past surgery on the right hip(s).  Patient currently rates pain in the right hip at 10 out of 10 with activity. Patient has night pain, worsening of pain with activity and weight bearing, trendelenberg gait, pain that interfers with activities of daily living and pain with passive range of motion. Patient has evidence of subchondral cysts, subchondral sclerosis, periarticular osteophytes and joint space narrowing by imaging studies. This condition presents safety issues increasing the risk of falls. There is no current active infection.  Patient Active Problem List   Diagnosis Date Noted  . Overweight (BMI 25.0-29.9) 08/18/2014  . S/P left THA, AA 08/17/2014   Past Medical History:  Diagnosis Date  . Arthritis    osteoarthritis left hip  . Elevated cholesterol   . GERD (gastroesophageal reflux disease)   . HPV in female   . Hypertension     Past Surgical History:  Procedure Laterality Date  . ABDOMINAL HYSTERECTOMY    . BREAST EXCISIONAL BIOPSY Left   . BREAST SURGERY     lumpectomy-benign  . CERVICAL BIOPSY  W/ LOOP ELECTRODE EXCISION    . TOTAL HIP ARTHROPLASTY Left 08/17/2014   Procedure: LEFT TOTAL HIP ARTHROPLASTY ANTERIOR APPROACH;  Surgeon: Durene Romans, MD;  Location: [facility];  Service: Orthopedics;  Laterality: Left;  . TUBAL LIGATION      Current Outpatient  Medications  Medication Sig Dispense Refill Last Dose  . aspirin EC 81 MG tablet Take 81 mg by mouth daily.     Marland Kitchen CALCIUM CARBONATE-VITAMIN D PO Take 1 tablet by mouth daily with breakfast.    Past Week at Unknown time  . Cholecalciferol (VITAMIN D) 50 MCG (2000 UT) CAPS Take 2,000 Units by mouth daily.    Past Week at Unknown time  . docusate sodium (COLACE) 100 MG capsule Take 1 capsule (100 mg total) by mouth 2 (two) times daily. (Patient not taking: Reported on 05/15/2018) 10 capsule 0 Not Taking at Unknown time  . ferrous sulfate 325 (65 FE) MG tablet Take 1 tablet (325 mg total) by mouth 3 (three) times daily after meals. (Patient not taking: Reported on 05/15/2018)  3 Not Taking at Unknown time  . hydrochlorothiazide (HYDRODIURIL) 25 MG tablet Take 25 mg by mouth every morning.   08/17/2014 at 0730  . HYDROcodone-acetaminophen (NORCO) 7.5-325 MG per tablet Take 1-2 tablets by mouth every 4 (four) hours as needed for moderate pain or severe pain. (Patient not taking: Reported on 05/15/2018) 100 tablet 0 Not Taking at Unknown time  . irbesartan (AVAPRO) 300 MG tablet Take 150 mg by mouth every morning.   08/17/2014 at 0730  . methocarbamol (ROBAXIN) 500 MG tablet Take 1 tablet (500 mg total) by mouth every 6 (six) hours as needed for muscle spasms. (Patient not taking: Reported on 05/15/2018) 50 tablet 0 Not Taking at Unknown time  . omeprazole (PRILOSEC) 40 MG capsule Take 40 mg by mouth daily.     Marland Kitchen  polyethylene glycol (MIRALAX / GLYCOLAX) packet Take 17 g by mouth 2 (two) times daily. (Patient not taking: Reported on 05/15/2018) 14 each 0 Not Taking at Unknown time  . TURMERIC PO Take 200 mg by mouth daily.     . vitamin B-12 (CYANOCOBALAMIN) 1000 MCG tablet Take 1,000 mcg by mouth daily.      No current facility-administered medications for this visit.    Allergies  Allergen Reactions  . Prinivil [Lisinopril] Swelling and Rash    Social History   Tobacco Use  . Smoking status: Never Smoker   Substance Use Topics  . Alcohol use: Yes    Comment: wine x2 glasses twice weekly    No family history on file.   ROS  Objective:  Physical Exam  Vital signs in last 24 hours: @VSRANGES @  Labs:   Estimated body mass index is 25.29 kg/m as calculated from the following:   Height as of 08/17/14: 5\' 5"  (1.651 m).   Weight as of 08/17/14: 68.9 kg.   Imaging Review Plain radiographs demonstrate severe degenerative joint disease of the right hip(s). The bone quality appears to be adequate for age and reported activity level.    Preoperative templating of the joint replacement has been completed, documented, and submitted to the Operating Room personnel in order to optimize intra-operative equipment management.     Assessment/Plan:  End stage arthritis, right hip(s)  The patient history, physical examination, clinical judgement of the provider and imaging studies are consistent with end stage degenerative joint disease of the right hip(s) and total hip arthroplasty is deemed medically necessary. The treatment options including medical management, injection therapy, arthroscopy and arthroplasty were discussed at length. The risks and benefits of total hip arthroplasty were presented and reviewed. The risks due to aseptic loosening, infection, stiffness, dislocation/subluxation,  thromboembolic complications and other imponderables were discussed.  The patient acknowledged the explanation, agreed to proceed with the plan and consent was signed. Patient is being admitted for inpatient treatment for surgery, pain control, PT, OT, prophylactic antibiotics, VTE prophylaxis, progressive ambulation and ADL's and discharge planning.The patient is planning to be discharged home with HEP. Has DME.

## 2018-05-21 NOTE — Patient Instructions (Signed)
Kayla Bridges  05/21/2018   Your procedure is scheduled on: 05-29-2018    Report to [facility] Main  Entrance     Report to admitting at 8:00AM    Call this number if you have problems the morning of surgery [phone]      Remember: Do not eat food or drink liquids :After Midnight. BRUSH YOUR TEETH MORNING OF SURGERY AND RINSE YOUR MOUTH OUT, NO CHEWING GUM CANDY OR MINTS.     Take these medicines the morning of surgery with A SIP OF WATER: OMEPRAZOLE                                 You may not have any metal on your body including hair pins and              piercings  Do not wear jewelry, make-up, lotions, powders or perfumes, deodorant             Do not wear nail polish.  Do not shave  48 hours prior to surgery.               Do not bring valuables to the hospital. [place] IS NOT             RESPONSIBLE   FOR VALUABLES.  Contacts, dentures or bridgework may not be worn into surgery.  Leave suitcase in the car. After surgery it may be brought to your room.                   Please read over the following fact sheets you were given: _____________________________________________________________________             [facility] - Preparing for Surgery Before surgery, you can play an important role.  Because skin is not sterile, your skin needs to be as free of germs as possible.  You can reduce the number of germs on your skin by washing with CHG (chlorahexidine gluconate) soap before surgery.  CHG is an antiseptic cleaner which kills germs and bonds with the skin to continue killing germs even after washing. Please DO NOT use if you have an allergy to CHG or antibacterial soaps.  If your skin becomes reddened/irritated stop using the CHG and inform your nurse when you arrive at Short Stay. Do not shave (including legs and underarms) for at least 48 hours prior to the first CHG shower.  You may shave your face/neck. Please follow these  instructions carefully:  1.  Shower with CHG Soap the night before surgery and the  morning of Surgery.  2.  If you choose to wash your hair, wash your hair first as usual with your  normal  shampoo.  3.  After you shampoo, rinse your hair and body thoroughly to remove the  shampoo.                           4.  Use CHG as you would any other liquid soap.  You can apply chg directly  to the skin and wash                       Gently with a scrungie or clean washcloth.  5.  Apply the CHG Soap to your body ONLY FROM THE  NECK DOWN.   Do not use on face/ open                           Wound or open sores. Avoid contact with eyes, ears mouth and genitals (private parts).                       Wash face,  Genitals (private parts) with your normal soap.             6.  Wash thoroughly, paying special attention to the area where your surgery  will be performed.  7.  Thoroughly rinse your body with warm water from the neck down.  8.  DO NOT shower/wash with your normal soap after using and rinsing off  the CHG Soap.                9.  Pat yourself dry with a clean towel.            10.  Wear clean pajamas.            11.  Place clean sheets on your bed the night of your first shower and do not  sleep with pets. Day of Surgery : Do not apply any lotions/deodorants the morning of surgery.  Please wear clean clothes to the hospital/surgery center.  FAILURE TO FOLLOW THESE INSTRUCTIONS MAY RESULT IN THE CANCELLATION OF YOUR SURGERY PATIENT SIGNATURE_________________________________  NURSE SIGNATURE__________________________________  ________________________________________________________________________   Adam Phenix  An incentive spirometer is a tool that can help keep your lungs clear and active. This tool measures how well you are filling your lungs with each breath. Taking long deep breaths may help reverse or decrease the chance of developing breathing (pulmonary) problems (especially  infection) following:  A long period of time when you are unable to move or be active. BEFORE THE PROCEDURE   If the spirometer includes an indicator to show your best effort, your nurse or respiratory therapist will set it to a desired goal.  If possible, sit up straight or lean slightly forward. Try not to slouch.  Hold the incentive spirometer in an upright position. INSTRUCTIONS FOR USE  1. Sit on the edge of your bed if possible, or sit up as far as you can in bed or on a chair. 2. Hold the incentive spirometer in an upright position. 3. Breathe out normally. 4. Place the mouthpiece in your mouth and seal your lips tightly around it. 5. Breathe in slowly and as deeply as possible, raising the piston or the ball toward the top of the column. 6. Hold your breath for 3-5 seconds or for as long as possible. Allow the piston or ball to fall to the bottom of the column. 7. Remove the mouthpiece from your mouth and breathe out normally. 8. Rest for a few seconds and repeat Steps 1 through 7 at least 10 times every 1-2 hours when you are awake. Take your time and take a few normal breaths between deep breaths. 9. The spirometer may include an indicator to show your best effort. Use the indicator as a goal to work toward during each repetition. 10. After each set of 10 deep breaths, practice coughing to be sure your lungs are clear. If you have an incision (the cut made at the time of surgery), support your incision when coughing by placing a pillow or rolled up towels firmly against it. Once you are able  to get out of bed, walk around indoors and cough well. You may stop using the incentive spirometer when instructed by your caregiver.  RISKS AND COMPLICATIONS  Take your time so you do not get dizzy or light-headed.  If you are in pain, you may need to take or ask for pain medication before doing incentive spirometry. It is harder to take a deep breath if you are having pain. AFTER  USE  Rest and breathe slowly and easily.  It can be helpful to keep track of a log of your progress. Your caregiver can provide you with a simple table to help with this. If you are using the spirometer at home, follow these instructions: [place] IF:   You are having difficultly using the spirometer.  You have trouble using the spirometer as often as instructed.  Your pain medication is not giving enough relief while using the spirometer.  You develop fever of 100.5 F (38.1 C) or higher. SEEK IMMEDIATE MEDICAL CARE IF:   You cough up bloody sputum that had not been present before.  You develop fever of 102 F (38.9 C) or greater.  You develop worsening pain at or near the incision site. MAKE SURE YOU:   Understand these instructions.  Will watch your condition.  Will get help right away if you are not doing well or get worse. Document Released: 09/10/2006 Document Revised: 07/23/2011 Document Reviewed: 11/11/2006 Exit[employer] Patient Information 2014 Exit[employer], [place].   ________________________________________________________________________  WHAT IS A BLOOD TRANSFUSION? Blood Transfusion Information  A transfusion is the replacement of blood or some of its parts. Blood is made up of multiple cells which provide different functions.  Red blood cells carry oxygen and are used for blood loss replacement.  White blood cells fight against infection.  Platelets control bleeding.  Plasma helps clot blood.  Other blood products are available for specialized needs, such as hemophilia or other clotting disorders. BEFORE THE TRANSFUSION  Who gives blood for transfusions?   Healthy volunteers who are fully evaluated to make sure their blood is safe. This is blood bank blood. Transfusion therapy is the safest it has ever been in the practice of medicine. Before blood is taken from a donor, a complete history is taken to make sure that person has no history of diseases  nor engages in risky social behavior (examples are intravenous drug use or sexual activity with multiple partners). The donor's travel history is screened to minimize risk of transmitting infections, such as malaria. The donated blood is tested for signs of infectious diseases, such as HIV and hepatitis. The blood is then tested to be sure it is compatible with you in order to minimize the chance of a transfusion reaction. If you or a relative donates blood, this is often done in anticipation of surgery and is not appropriate for emergency situations. It takes many days to process the donated blood. RISKS AND COMPLICATIONS Although transfusion therapy is very safe and saves many lives, the main dangers of transfusion include:   Getting an infectious disease.  Developing a transfusion reaction. This is an allergic reaction to something in the blood you were given. Every precaution is taken to prevent this. The decision to have a blood transfusion has been considered carefully by your caregiver before blood is given. Blood is not given unless the benefits outweigh the risks. AFTER THE TRANSFUSION  Right after receiving a blood transfusion, you will usually feel much better and more energetic. This is especially true  if your red blood cells have gotten low (anemic). The transfusion raises the level of the red blood cells which carry oxygen, and this usually causes an energy increase.  The nurse administering the transfusion will monitor you carefully for complications. HOME CARE INSTRUCTIONS  No special instructions are needed after a transfusion. You may find your energy is better. Speak with your caregiver about any limitations on activity for underlying diseases you may have. SEEK MEDICAL CARE IF:   Your condition is not improving after your transfusion.  You develop redness or irritation at the intravenous (IV) site. SEEK IMMEDIATE MEDICAL CARE IF:  Any of the following symptoms occur over the  next 12 hours:  Shaking chills.  You have a temperature by mouth above 102 F (38.9 C), not controlled by medicine.  Chest, back, or muscle pain.  People around you feel you are not acting correctly or are confused.  Shortness of breath or difficulty breathing.  Dizziness and fainting.  You get a rash or develop hives.  You have a decrease in urine output.  Your urine turns a dark color or changes to pink, red, or brown. Any of the following symptoms occur over the next 10 days:  You have a temperature by mouth above 102 F (38.9 C), not controlled by medicine.  Shortness of breath.  Weakness after normal activity.  The white part of the eye turns yellow (jaundice).  You have a decrease in the amount of urine or are urinating less often.  Your urine turns a dark color or changes to pink, red, or brown. Document Released: 04/27/2000 Document Revised: 07/23/2011 Document Reviewed: 12/15/2007 [facility] Patient Information 2014 [place], [place].  _______________________________________________________________________

## 2018-05-22 ENCOUNTER — Encounter (HOSPITAL_COMMUNITY)
Admission: RE | Admit: 2018-05-22 | Discharge: 2018-05-22 | Disposition: A | Discharge status: Home / Self Care | Payer: Medicare Other | Source: Ambulatory Visit | Attending: Orthopedic Surgery | Admitting: Orthopedic Surgery

## 2018-05-22 ENCOUNTER — Encounter (HOSPITAL_COMMUNITY): Payer: Self-pay

## 2018-05-22 ENCOUNTER — Other Ambulatory Visit: Payer: Self-pay

## 2018-05-22 DIAGNOSIS — M1611 Unilateral primary osteoarthritis, right hip: Secondary | ICD-10-CM | POA: Diagnosis not present

## 2018-05-22 DIAGNOSIS — Z01812 Encounter for preprocedural laboratory examination: Secondary | ICD-10-CM | POA: Diagnosis present

## 2018-05-22 LAB — CBC
HCT: 42.2 % (ref 36.0–46.0)
Hemoglobin: 13.8 g/dL (ref 12.0–15.0)
MCH: 31 pg (ref 26.0–34.0)
MCHC: 32.7 g/dL (ref 30.0–36.0)
MCV: 94.8 fL (ref 80.0–100.0)
Platelets: 300 10*3/uL (ref 150–400)
RBC: 4.45 MIL/uL (ref 3.87–5.11)
RDW: 11.5 % (ref 11.5–15.5)
WBC: 10 10*3/uL (ref 4.0–10.5)
nRBC: 0 % (ref 0.0–0.2)

## 2018-05-22 LAB — SURGICAL PCR SCREEN
MRSA, PCR: NEGATIVE
Staphylococcus aureus: NEGATIVE

## 2018-05-22 LAB — BASIC METABOLIC PANEL
Anion gap: 9 (ref 5–15)
BUN: 20 mg/dL (ref 8–23)
CALCIUM: 9.8 mg/dL (ref 8.9–10.3)
CO2: 29 mmol/L (ref 22–32)
Chloride: 102 mmol/L (ref 98–111)
Creatinine, Ser: 0.72 mg/dL (ref 0.44–1.00)
GFR calc Af Amer: >60 mL/min (ref >60)
GFR calc non Af Amer: >60 mL/min (ref >60)
Glucose, Bld: 107 mg/dL — ABNORMAL HIGH (ref 70–99)
Potassium: 4 mmol/L (ref 3.5–5.1)
Sodium: 140 mmol/L (ref 135–145)

## 2018-05-22 NOTE — Progress Notes (Signed)
Medical clearance Dr Shirlean Mylar on chart   EKG, [id number] 04-22-18 on chart [facility] physicians

## 2018-05-29 ENCOUNTER — Encounter (HOSPITAL_COMMUNITY)
Admission: RE | Disposition: A | Discharge status: Home / Self Care | Payer: Self-pay | Source: Home / Self Care | Attending: Orthopedic Surgery

## 2018-05-29 ENCOUNTER — Other Ambulatory Visit: Payer: Self-pay

## 2018-05-29 ENCOUNTER — Inpatient Hospital Stay (HOSPITAL_COMMUNITY)
Admission: RE | Admit: 2018-05-29 | Discharge: 2018-05-30 | DRG: 470 | Disposition: A | Discharge status: Home / Self Care | Payer: Medicare Other | Attending: Orthopedic Surgery | Admitting: Orthopedic Surgery

## 2018-05-29 ENCOUNTER — Inpatient Hospital Stay (HOSPITAL_COMMUNITY): Payer: Medicare Other

## 2018-05-29 ENCOUNTER — Inpatient Hospital Stay (HOSPITAL_COMMUNITY): Payer: Medicare Other | Admitting: Certified Registered Nurse Anesthetist

## 2018-05-29 ENCOUNTER — Encounter (HOSPITAL_COMMUNITY): Payer: Self-pay | Admitting: Certified Registered Nurse Anesthetist

## 2018-05-29 ENCOUNTER — Inpatient Hospital Stay (HOSPITAL_COMMUNITY): Payer: Medicare Other | Admitting: Physician Assistant

## 2018-05-29 DIAGNOSIS — Z888 Allergy status to other drugs, medicaments and biological substances status: Secondary | ICD-10-CM

## 2018-05-29 DIAGNOSIS — Z6826 Body mass index (BMI) 26.0-26.9, adult: Secondary | ICD-10-CM

## 2018-05-29 DIAGNOSIS — Z79899 Other long term (current) drug therapy: Secondary | ICD-10-CM

## 2018-05-29 DIAGNOSIS — E663 Overweight: Secondary | ICD-10-CM | POA: Diagnosis present

## 2018-05-29 DIAGNOSIS — Z96642 Presence of left artificial hip joint: Secondary | ICD-10-CM | POA: Diagnosis present

## 2018-05-29 DIAGNOSIS — Z7982 Long term (current) use of aspirin: Secondary | ICD-10-CM

## 2018-05-29 DIAGNOSIS — K219 Gastro-esophageal reflux disease without esophagitis: Secondary | ICD-10-CM | POA: Diagnosis present

## 2018-05-29 DIAGNOSIS — M1611 Unilateral primary osteoarthritis, right hip: Secondary | ICD-10-CM | POA: Diagnosis present

## 2018-05-29 DIAGNOSIS — Z90710 Acquired absence of both cervix and uterus: Secondary | ICD-10-CM

## 2018-05-29 DIAGNOSIS — E7800 Pure hypercholesterolemia, unspecified: Secondary | ICD-10-CM | POA: Diagnosis present

## 2018-05-29 DIAGNOSIS — I10 Essential (primary) hypertension: Secondary | ICD-10-CM | POA: Diagnosis present

## 2018-05-29 DIAGNOSIS — M25751 Osteophyte, right hip: Secondary | ICD-10-CM | POA: Diagnosis present

## 2018-05-29 DIAGNOSIS — Z09 Encounter for follow-up examination after completed treatment for conditions other than malignant neoplasm: Secondary | ICD-10-CM

## 2018-05-29 DIAGNOSIS — Z419 Encounter for procedure for purposes other than remedying health state, unspecified: Secondary | ICD-10-CM

## 2018-05-29 HISTORY — PX: TOTAL HIP ARTHROPLASTY: SHX124

## 2018-05-29 LAB — TYPE AND SCREEN
ABO/RH(D): B POS
ANTIBODY SCREEN: NEGATIVE

## 2018-05-29 SURGERY — ARTHROPLASTY, HIP, TOTAL, ANTERIOR APPROACH
Anesthesia: Spinal | Laterality: Right

## 2018-05-29 MED ORDER — DEXAMETHASONE SODIUM PHOSPHATE 10 MG/ML IJ SOLN
INTRAMUSCULAR | Status: AC
  Filled 2018-05-29: qty 1

## 2018-05-29 MED ORDER — PROPOFOL 10 MG/ML IV BOLUS
INTRAVENOUS | Status: DC | PRN
  Administered 2018-05-29: 20 mg via INTRAVENOUS

## 2018-05-29 MED ORDER — LIDOCAINE 2% (20 MG/ML) 5 ML SYRINGE
INTRAMUSCULAR | Status: AC
  Filled 2018-05-29: qty 5

## 2018-05-29 MED ORDER — CHLORHEXIDINE GLUCONATE 4 % EX LIQD: 60.0000 mL | Freq: Once | CUTANEOUS | Status: DC

## 2018-05-29 MED ORDER — KETOROLAC TROMETHAMINE 30 MG/ML IJ SOLN
INTRAMUSCULAR | Status: DC | PRN
  Administered 2018-05-29: 30 mg

## 2018-05-29 MED ORDER — HYDROCODONE-ACETAMINOPHEN 7.5-325 MG PO TABS: 1.0000 | ORAL_TABLET | ORAL | Status: DC | PRN

## 2018-05-29 MED ORDER — MIDAZOLAM HCL 2 MG/2ML IJ SOLN
INTRAMUSCULAR | Status: AC
  Filled 2018-05-29: qty 2

## 2018-05-29 MED ORDER — ALUM & MAG HYDROXIDE-SIMETH 200-200-20 MG/5ML PO SUSP: 30.0000 mL | ORAL | Status: DC | PRN

## 2018-05-29 MED ORDER — ONDANSETRON HCL 4 MG/2ML IJ SOLN
INTRAMUSCULAR | Status: AC
  Filled 2018-05-29: qty 2

## 2018-05-29 MED ORDER — ISOPROPYL ALCOHOL 70 % SOLN
Status: DC | PRN
  Administered 2018-05-29: 1 via TOPICAL

## 2018-05-29 MED ORDER — SODIUM CHLORIDE 0.9 % IV SOLN
INTRAVENOUS | Status: DC
  Administered 2018-05-29 – 2018-05-30 (×3): via INTRAVENOUS

## 2018-05-29 MED ORDER — CEFAZOLIN SODIUM-DEXTROSE 2-4 GM/100ML-% IV SOLN
2.0000 g | Freq: Four times a day (QID) | INTRAVENOUS | Status: AC
  Administered 2018-05-29 (×2): 2 g via INTRAVENOUS
  Filled 2018-05-29 (×2): qty 100

## 2018-05-29 MED ORDER — ACETAMINOPHEN 325 MG PO TABS: 325.0000 mg | ORAL_TABLET | Freq: Four times a day (QID) | ORAL | Status: DC | PRN

## 2018-05-29 MED ORDER — PHENOL 1.4 % MT LIQD: 1.0000 | OROMUCOSAL | Status: DC | PRN

## 2018-05-29 MED ORDER — FENTANYL CITRATE (PF) 100 MCG/2ML IJ SOLN
INTRAMUSCULAR | Status: DC | PRN
  Administered 2018-05-29 (×2): 50 ug via INTRAVENOUS

## 2018-05-29 MED ORDER — VITAMIN D 25 MCG (1000 UNIT) PO TABS
2000.0000 [IU] | ORAL_TABLET | Freq: Every day | ORAL | Status: DC
  Administered 2018-05-30: 2000 [IU] via ORAL
  Filled 2018-05-29: qty 2

## 2018-05-29 MED ORDER — IRBESARTAN 150 MG PO TABS
150.0000 mg | ORAL_TABLET | Freq: Every morning | ORAL | Status: DC
  Administered 2018-05-30: 150 mg via ORAL
  Filled 2018-05-29: qty 1

## 2018-05-29 MED ORDER — PROPOFOL 10 MG/ML IV BOLUS
INTRAVENOUS | Status: AC
  Filled 2018-05-29: qty 80

## 2018-05-29 MED ORDER — KETOROLAC TROMETHAMINE 30 MG/ML IJ SOLN
INTRAMUSCULAR | Status: AC
  Filled 2018-05-29: qty 1

## 2018-05-29 MED ORDER — PANTOPRAZOLE SODIUM 40 MG PO TBEC
80.0000 mg | DELAYED_RELEASE_TABLET | Freq: Every day | ORAL | Status: DC
  Administered 2018-05-30: 80 mg via ORAL
  Filled 2018-05-29: qty 2

## 2018-05-29 MED ORDER — SODIUM CHLORIDE 0.9 % IV SOLN
INTRAVENOUS | Status: DC | PRN
  Administered 2018-05-29: 75 ug/min via INTRAVENOUS

## 2018-05-29 MED ORDER — SODIUM CHLORIDE (PF) 0.9 % IJ SOLN
INTRAMUSCULAR | Status: AC
  Filled 2018-05-29: qty 50

## 2018-05-29 MED ORDER — FENTANYL CITRATE (PF) 100 MCG/2ML IJ SOLN: 25.0000 ug | INTRAMUSCULAR | Status: DC | PRN

## 2018-05-29 MED ORDER — ACETAMINOPHEN 10 MG/ML IV SOLN
1000.0000 mg | INTRAVENOUS | Status: AC
  Administered 2018-05-29: 1000 mg via INTRAVENOUS
  Filled 2018-05-29: qty 100

## 2018-05-29 MED ORDER — PROPOFOL 500 MG/50ML IV EMUL
INTRAVENOUS | Status: DC | PRN
  Administered 2018-05-29: 100 ug/kg/min via INTRAVENOUS

## 2018-05-29 MED ORDER — SODIUM CHLORIDE 0.9 % IV SOLN: INTRAVENOUS | Status: DC

## 2018-05-29 MED ORDER — TRANEXAMIC ACID-NACL 1000-0.7 MG/100ML-% IV SOLN
1000.0000 mg | INTRAVENOUS | Status: AC
  Administered 2018-05-29: 1000 mg via INTRAVENOUS
  Filled 2018-05-29: qty 100

## 2018-05-29 MED ORDER — METHOCARBAMOL 500 MG IVPB - SIMPLE MED
500.0000 mg | Freq: Four times a day (QID) | INTRAVENOUS | Status: DC | PRN
  Filled 2018-05-29: qty 50

## 2018-05-29 MED ORDER — MORPHINE SULFATE (PF) 2 MG/ML IV SOLN: 0.5000 mg | INTRAVENOUS | Status: DC | PRN

## 2018-05-29 MED ORDER — METHOCARBAMOL 500 MG PO TABS
500.0000 mg | ORAL_TABLET | Freq: Four times a day (QID) | ORAL | Status: DC | PRN
  Administered 2018-05-29 – 2018-05-30 (×2): 500 mg via ORAL
  Filled 2018-05-29 (×2): qty 1

## 2018-05-29 MED ORDER — POVIDONE-IODINE 10 % EX SWAB: 2.0000 "application " | Freq: Once | CUTANEOUS | Status: DC

## 2018-05-29 MED ORDER — DEXAMETHASONE SODIUM PHOSPHATE 10 MG/ML IJ SOLN
INTRAMUSCULAR | Status: DC | PRN
  Administered 2018-05-29: 10 mg via INTRAVENOUS

## 2018-05-29 MED ORDER — VITAMIN B-12 1000 MCG PO TABS
1000.0000 ug | ORAL_TABLET | Freq: Every day | ORAL | Status: DC
  Administered 2018-05-30: 1000 ug via ORAL
  Filled 2018-05-29: qty 1

## 2018-05-29 MED ORDER — POLYETHYLENE GLYCOL 3350 17 G PO PACK: 17.0000 g | PACK | Freq: Every day | ORAL | Status: DC | PRN

## 2018-05-29 MED ORDER — SENNA 8.6 MG PO TABS
1.0000 | ORAL_TABLET | Freq: Two times a day (BID) | ORAL | Status: DC
  Administered 2018-05-29 – 2018-05-30 (×2): 8.6 mg via ORAL
  Filled 2018-05-29 (×2): qty 1

## 2018-05-29 MED ORDER — DEXAMETHASONE SODIUM PHOSPHATE 10 MG/ML IJ SOLN
10.0000 mg | Freq: Once | INTRAMUSCULAR | Status: AC
  Administered 2018-05-30: 10 mg via INTRAVENOUS
  Filled 2018-05-29: qty 1

## 2018-05-29 MED ORDER — ASPIRIN 81 MG PO CHEW
81.0000 mg | CHEWABLE_TABLET | Freq: Two times a day (BID) | ORAL | Status: DC
  Administered 2018-05-29 – 2018-05-30 (×2): 81 mg via ORAL
  Filled 2018-05-29 (×2): qty 1

## 2018-05-29 MED ORDER — ONDANSETRON HCL 4 MG/2ML IJ SOLN
INTRAMUSCULAR | Status: DC | PRN
  Administered 2018-05-29: 4 mg via INTRAVENOUS

## 2018-05-29 MED ORDER — METOCLOPRAMIDE HCL 5 MG PO TABS: 5.0000 mg | ORAL_TABLET | Freq: Three times a day (TID) | ORAL | Status: DC | PRN

## 2018-05-29 MED ORDER — LACTATED RINGERS IV SOLN
INTRAVENOUS | Status: DC
  Administered 2018-05-29 (×2): via INTRAVENOUS

## 2018-05-29 MED ORDER — DOCUSATE SODIUM 100 MG PO CAPS
100.0000 mg | ORAL_CAPSULE | Freq: Two times a day (BID) | ORAL | Status: DC
  Administered 2018-05-29 – 2018-05-30 (×2): 100 mg via ORAL
  Filled 2018-05-29 (×2): qty 1

## 2018-05-29 MED ORDER — EPHEDRINE SULFATE-NACL 50-0.9 MG/10ML-% IV SOSY
PREFILLED_SYRINGE | INTRAVENOUS | Status: DC | PRN
  Administered 2018-05-29 (×2): 5 mg via INTRAVENOUS

## 2018-05-29 MED ORDER — ONDANSETRON HCL 4 MG/2ML IJ SOLN: 4.0000 mg | Freq: Once | INTRAMUSCULAR | Status: DC | PRN

## 2018-05-29 MED ORDER — MENTHOL 3 MG MT LOZG: 1.0000 | LOZENGE | OROMUCOSAL | Status: DC | PRN

## 2018-05-29 MED ORDER — WATER FOR IRRIGATION, STERILE IR SOLN
Status: DC | PRN
  Administered 2018-05-29: 2000 mL

## 2018-05-29 MED ORDER — KETOROLAC TROMETHAMINE 15 MG/ML IJ SOLN
7.5000 mg | Freq: Four times a day (QID) | INTRAMUSCULAR | Status: AC
  Administered 2018-05-29 – 2018-05-30 (×4): 7.5 mg via INTRAVENOUS
  Filled 2018-05-29 (×4): qty 1

## 2018-05-29 MED ORDER — BUPIVACAINE-EPINEPHRINE (PF) 0.25% -1:200000 IJ SOLN
INTRAMUSCULAR | Status: AC
  Filled 2018-05-29: qty 30

## 2018-05-29 MED ORDER — METOCLOPRAMIDE HCL 5 MG/ML IJ SOLN: 5.0000 mg | Freq: Three times a day (TID) | INTRAMUSCULAR | Status: DC | PRN

## 2018-05-29 MED ORDER — DIPHENHYDRAMINE HCL 12.5 MG/5ML PO ELIX: 12.5000 mg | ORAL_SOLUTION | ORAL | Status: DC | PRN

## 2018-05-29 MED ORDER — PHENYLEPHRINE HCL 10 MG/ML IJ SOLN
INTRAMUSCULAR | Status: AC
  Filled 2018-05-29: qty 2

## 2018-05-29 MED ORDER — HYDROCODONE-ACETAMINOPHEN 5-325 MG PO TABS
1.0000 | ORAL_TABLET | ORAL | Status: DC | PRN
  Administered 2018-05-29: 1 via ORAL
  Filled 2018-05-29 (×2): qty 1

## 2018-05-29 MED ORDER — 0.9 % SODIUM CHLORIDE (POUR BTL) OPTIME
TOPICAL | Status: DC | PRN
  Administered 2018-05-29: 1000 mL

## 2018-05-29 MED ORDER — FENTANYL CITRATE (PF) 100 MCG/2ML IJ SOLN
INTRAMUSCULAR | Status: AC
  Filled 2018-05-29: qty 2

## 2018-05-29 MED ORDER — EPHEDRINE 5 MG/ML INJ
INTRAVENOUS | Status: AC
  Filled 2018-05-29: qty 10

## 2018-05-29 MED ORDER — CEFAZOLIN SODIUM-DEXTROSE 2-4 GM/100ML-% IV SOLN
2.0000 g | INTRAVENOUS | Status: AC
  Administered 2018-05-29: 2 g via INTRAVENOUS
  Filled 2018-05-29: qty 100

## 2018-05-29 MED ORDER — SODIUM CHLORIDE 0.9 % IR SOLN
Status: DC | PRN
  Administered 2018-05-29: 3000 mL

## 2018-05-29 MED ORDER — BUPIVACAINE IN DEXTROSE 0.75-8.25 % IT SOLN
INTRATHECAL | Status: DC | PRN
  Administered 2018-05-29: 1.8 mL via INTRATHECAL

## 2018-05-29 MED ORDER — MIDAZOLAM HCL 5 MG/5ML IJ SOLN
INTRAMUSCULAR | Status: DC | PRN
  Administered 2018-05-29: 2 mg via INTRAVENOUS

## 2018-05-29 MED ORDER — ONDANSETRON HCL 4 MG/2ML IJ SOLN: 4.0000 mg | Freq: Four times a day (QID) | INTRAMUSCULAR | Status: DC | PRN

## 2018-05-29 MED ORDER — HYDROCHLOROTHIAZIDE 25 MG PO TABS
25.0000 mg | ORAL_TABLET | Freq: Every morning | ORAL | Status: DC
  Filled 2018-05-29: qty 1

## 2018-05-29 MED ORDER — SODIUM CHLORIDE (PF) 0.9 % IJ SOLN
INTRAMUSCULAR | Status: DC | PRN
  Administered 2018-05-29: 30 mL

## 2018-05-29 MED ORDER — SODIUM CHLORIDE 0.9 % IR SOLN
Status: DC | PRN
  Administered 2018-05-29: 1000 mL

## 2018-05-29 MED ORDER — CALCIUM CARBONATE-VITAMIN D 500-200 MG-UNIT PO TABS
2.0000 | ORAL_TABLET | Freq: Every day | ORAL | Status: DC
  Administered 2018-05-30: 2 via ORAL
  Filled 2018-05-29: qty 2

## 2018-05-29 MED ORDER — ONDANSETRON HCL 4 MG PO TABS: 4.0000 mg | ORAL_TABLET | Freq: Four times a day (QID) | ORAL | Status: DC | PRN

## 2018-05-29 MED ORDER — BUPIVACAINE-EPINEPHRINE 0.25% -1:200000 IJ SOLN
INTRAMUSCULAR | Status: DC | PRN
  Administered 2018-05-29: 30 mL

## 2018-05-29 SURGICAL SUPPLY — 53 items
ADH SKN CLS APL DERMABOND .7 (GAUZE/BANDAGES/DRESSINGS) ×1
BAG DECANTER FOR FLEXI CONT (MISCELLANEOUS) IMPLANT
BAG SPEC THK2 15X12 ZIP CLS (MISCELLANEOUS)
BAG ZIPLOCK 12X15 (MISCELLANEOUS) IMPLANT
BLADE SURG SZ10 CARB STEEL (BLADE) ×4 IMPLANT
CHLORAPREP W/TINT 26ML (MISCELLANEOUS) ×2 IMPLANT
CLOTH BEACON ORANGE TIMEOUT ST (SAFETY) ×2 IMPLANT
COVER PERINEAL POST (MISCELLANEOUS) ×2 IMPLANT
COVER SURGICAL LIGHT HANDLE (MISCELLANEOUS) ×2 IMPLANT
COVER WAND RF STERILE (DRAPES) ×1 IMPLANT
DECANTER SPIKE VIAL GLASS SM (MISCELLANEOUS) ×2 IMPLANT
DERMABOND ADVANCED (GAUZE/BANDAGES/DRESSINGS) ×1
DERMABOND ADVANCED .7 DNX12 (GAUZE/BANDAGES/DRESSINGS) ×2 IMPLANT
DRAPE IMP U-DRAPE 54X76 (DRAPES) ×2 IMPLANT
DRAPE SHEET LG 3/4 BI-LAMINATE (DRAPES) ×6 IMPLANT
DRAPE STERI IOBAN 125X83 (DRAPES) ×2 IMPLANT
DRAPE U-SHAPE 47X51 STRL (DRAPES) ×4 IMPLANT
DRSG AQUACEL AG ADV 3.5X10 (GAUZE/BANDAGES/DRESSINGS) ×2 IMPLANT
ELECT PENCIL ROCKER SW 15FT (MISCELLANEOUS) ×2 IMPLANT
ELECT REM PT RETURN 15FT ADLT (MISCELLANEOUS) ×2 IMPLANT
GAUZE SPONGE 4X4 12PLY STRL (GAUZE/BANDAGES/DRESSINGS) ×2 IMPLANT
GLOVE BIO SURGEON STRL SZ8.5 (GLOVE) ×4 IMPLANT
GLOVE BIOGEL PI IND STRL 8.5 (GLOVE) ×1 IMPLANT
GLOVE BIOGEL PI INDICATOR 8.5 (GLOVE) ×1
GOWN SPEC L3 XXLG W/TWL (GOWN DISPOSABLE) ×2 IMPLANT
HANDPIECE INTERPULSE COAX TIP (DISPOSABLE) ×2
HEAD CERAMIC 36 PLUS5 (Hips) ×1 IMPLANT
HOLDER FOLEY CATH W/STRAP (MISCELLANEOUS) ×2 IMPLANT
HOOD PEEL AWAY FLYTE STAYCOOL (MISCELLANEOUS) ×8 IMPLANT
LINER NEUTRAL 52X36MM PLUS 4 (Liner) ×1 IMPLANT
MARKER SKIN DUAL TIP RULER LAB (MISCELLANEOUS) ×2 IMPLANT
NDL SPNL 18GX3.5 QUINCKE PK (NEEDLE) ×1 IMPLANT
NEEDLE SPNL 18GX3.5 QUINCKE PK (NEEDLE) ×2 IMPLANT
PACK ANTERIOR HIP CUSTOM (KITS) ×2 IMPLANT
PIN SECTOR W/GRIP ACE CUP 52MM (Hips) ×1 IMPLANT
SAW OSC TIP CART 19.5X105X1.3 (SAW) ×2 IMPLANT
SEALER BIPOLAR AQUA 6.0 (INSTRUMENTS) ×2 IMPLANT
SET HNDPC FAN SPRY TIP SCT (DISPOSABLE) ×1 IMPLANT
STEM TRI LOC BPS SZ3 W GRIPTON IMPLANT
SUT ETHIBOND NAB CT1 #1 30IN (SUTURE) ×4 IMPLANT
SUT MNCRL AB 3-0 PS2 18 (SUTURE) ×2 IMPLANT
SUT MNCRL AB 4-0 PS2 18 (SUTURE) ×2 IMPLANT
SUT MON AB 2-0 CT1 36 (SUTURE) ×4 IMPLANT
SUT STRATAFIX PDO 1 14 VIOLET (SUTURE) ×2
SUT STRATFX PDO 1 14 VIOLET (SUTURE) ×1
SUT VIC AB 2-0 CT1 27 (SUTURE) ×2
SUT VIC AB 2-0 CT1 TAPERPNT 27 (SUTURE) ×1 IMPLANT
SUTURE STRATFX PDO 1 14 VIOLET (SUTURE) ×1 IMPLANT
SYR 50ML LL SCALE MARK (SYRINGE) ×2 IMPLANT
TRAY FOLEY MTR SLVR 16FR STAT (SET/KITS/TRAYS/PACK) IMPLANT
TRI LOC BPS SZ 3 W GRIPTON ×2 IMPLANT
WATER STERILE IRR 1000ML POUR (IV SOLUTION) ×2 IMPLANT
YANKAUER SUCT BULB TIP 10FT TU (MISCELLANEOUS) ×2 IMPLANT

## 2018-05-29 NOTE — Interval H&P Note (Signed)
History and Physical Interval Note:  05/29/2018 9:44 AM  [facility]  has presented today for surgery, with the diagnosis of Degenerative joint disease right hip  The various methods of treatment have been discussed with the patient and family. After consideration of risks, benefits and other options for treatment, the patient has consented to  Procedure(s): TOTAL HIP ARTHROPLASTY ANTERIOR APPROACH (Right) as a surgical intervention .  The patient's history has been reviewed, patient examined, no change in status, stable for surgery.  I have reviewed the patient's chart and labs.  Questions were answered to the patient's satisfaction.     Iline OvenBrian J Syaire Saber

## 2018-05-29 NOTE — Transfer of Care (Signed)
Immediate Anesthesia Transfer of Care Note  Patient: Kayla Bridges  Procedure(s) Performed: TOTAL HIP ARTHROPLASTY ANTERIOR APPROACH (Right )  Patient Location: PACU  Anesthesia Type:Spinal  Level of Consciousness: awake, alert , oriented and patient cooperative  Airway & Oxygen Therapy: Patient Spontanous Breathing and Patient connected to face mask oxygen  Post-op Assessment: Report given to RN, Post -op Vital signs reviewed and stable and Patient moving all extremities  Post vital signs: Reviewed and stable  Last Vitals:  Vitals Value Taken Time  BP 101/74 05/29/2018  1:47 PM  Temp 36.4 C 05/29/2018  1:45 PM  Pulse 87 05/29/2018  1:51 PM  Resp 21 05/29/2018  1:51 PM  SpO2 95 % 05/29/2018  1:51 PM  Vitals shown include unvalidated device data.  Last Pain:  Vitals:   05/29/18 0830  TempSrc: Oral  PainSc: 0-No pain         Complications: No apparent anesthesia complications

## 2018-05-29 NOTE — Op Note (Signed)
OPERATIVE REPORT  SURGEON: Rod Can, MD   ASSISTANT: Nehemiah Massed, PA-C.  PREOPERATIVE DIAGNOSIS: Right hip arthritis.   POSTOPERATIVE DIAGNOSIS: Right hip arthritis.   PROCEDURE: Right total hip arthroplasty, anterior approach.   IMPLANTS: DePuy Tri Lock stem, size 3, hi offset. DePuy Pinnacle Cup, size 52 mm. DePuy Altrx liner, size 36 by 52 mm, +4 neutral. DePuy Biolox ceramic head ball, size 36 + 5 mm.  ANESTHESIA:  MAC and Spinal  ESTIMATED BLOOD LOSS:-350 mL    ANTIBIOTICS: 2 g Ancef.  DRAINS: None.  COMPLICATIONS: None.   CONDITION: PACU - hemodynamically stable.   BRIEF CLINICAL NOTE: Kayla Bridges is a 68 y.o. female with a long-standing history of Right hip arthritis. After failing conservative management, the patient was indicated for total hip arthroplasty. The risks, benefits, and alternatives to the procedure were explained, and the patient elected to proceed.  PROCEDURE IN DETAIL: Surgical site was marked by myself in the pre-op holding area. Once inside the operating room, spinal anesthesia was obtained, and a foley catheter was inserted. The patient was then positioned on the Hana table. All bony prominences were well padded. The hip was prepped and draped in the normal sterile surgical fashion. A time-out was called verifying side and site of surgery. The patient received IV antibiotics within 60 minutes of beginning the procedure.  The direct anterior approach to the hip was performed through the Hueter interval. Lateral femoral circumflex vessels were treated with the Auqumantys. The anterior capsule was exposed and an inverted T capsulotomy was made.The femoral neck cut was made to the level of the templated cut. A corkscrew was placed into the head and the head was removed. The femoral head was found to have eburnated bone. The head was passed to the back table and was measured.  Acetabular exposure was achieved, and the pulvinar and  labrum were excised. Sequential reaming of the acetabulum was then performed up to a size 51 mm reamer. A 52 mm cup was then opened and impacted into place at approximately 40 degrees of abduction and 20 degrees of anteversion. The final polyethylene liner was impacted into place and acetabular osteophytes were removed.   I then gained femoral exposure taking care to protect the abductors and greater trochanter. This was performed using standard external rotation, extension, and adduction. The capsule was peeled off the inner aspect of the greater trochanter, taking care to preserve the short external rotators. A cookie cutter was used to enter the femoral canal, and then the femoral canal finder was placed. Sequential broaching was performed up to a size 3. Calcar planer was used on the femoral neck remnant. I placed a hi offset neck and a trial head ball. The hip was reduced. Leg lengths and offset were checked fluoroscopically. The hip was dislocated and trial components were removed. The final implants were placed, and the hip was reduced.  Fluoroscopy was used to confirm component position and leg lengths. At 90 degrees of external rotation and full extension, the hip was stable to an anterior directed force.  The wound was copiously irrigated with normal saline using pulse lavage. Marcaine solution was injected into the periarticular soft tissue. The wound was closed in layers using #1 Vicryl and V-Loc for the fascia, 2-0 Vicryl for the subcutaneous fat, 2-0 Monocryl for the deep dermal layer, 3-0 running Monocryl subcuticular stitch, and Dermabond for the skin. Once the glue was fully dried, an Aquacell Ag dressing was applied. The patient was transported to  the recovery room in stable condition. Sponge, needle, and instrument counts were correct at the end of the case x2. The patient tolerated the procedure well and there were no known complications.  Please note that a surgical  assistant was a medical necessity for this procedure to perform it in a safe and expeditious manner. Assistant was necessary to provide appropriate retraction of vital neurovascular structures, to prevent femoral fracture, and to allow for anatomic placement of the prosthesis.

## 2018-05-29 NOTE — Anesthesia Preprocedure Evaluation (Addendum)
Anesthesia Evaluation  Patient identified by MRN, date of birth, ID band Patient awake    Reviewed: Allergy & Precautions, NPO status , Patient's Chart, lab work & pertinent test results  Airway Mallampati: II  TM Distance: >3 FB Neck ROM: Full    Dental no notable dental hx.    Pulmonary neg pulmonary ROS,    Pulmonary exam normal breath sounds clear to auscultation       Cardiovascular hypertension, Pt. on medications Normal cardiovascular exam Rhythm:Regular Rate:Normal  ECG: SR, rate 85   Neuro/Psych negative neurological ROS  negative psych ROS   GI/Hepatic Neg liver ROS, GERD  Medicated,  Endo/Other  negative endocrine ROS  Renal/GU negative Renal ROS     Musculoskeletal  (+) Arthritis , Osteoarthritis,    Abdominal   Peds  Hematology negative hematology ROS (+)   Anesthesia Other Findings Degenerative joint disease right hip  Reproductive/Obstetrics                            Anesthesia Physical Anesthesia Plan  ASA: II  Anesthesia Plan: Spinal   Post-op Pain Management:    Induction:   PONV Risk Score and Plan: 2 and Propofol infusion and Treatment may vary due to age or medical condition  Airway Management Planned: Natural Airway  Additional Equipment:   Intra-op Plan:   Post-operative Plan:   Informed Consent: I have reviewed the patients History and Physical, chart, labs and discussed the procedure including the risks, benefits and alternatives for the proposed anesthesia with the patient or authorized representative who has indicated his/her understanding and acceptance.     Dental advisory given  Plan Discussed with: CRNA  Anesthesia Plan Comments:         Anesthesia Quick Evaluation

## 2018-05-29 NOTE — Evaluation (Signed)
Physical Therapy Evaluation Patient Details Name: Kayla Bridges MRN: [id number] DOB: 10/13/1950 Today's Date: 05/29/2018   History of Present Illness  68 yo female s/p R DA-THA on 05/29/18. PMH includes L THA, OA, GERD, HTN, R TKR.   Clinical Impression  Pt presents with R hip pain, increased time and effort to perform mobility tasks, post-surgical R hip weakness, and decreased tolerance for ambulation. Pt to benefit from acute PT to address deficits. Pt ambulated 30 ft with RW with min guard assist, verbal cuing throughout. Pt educated on ankle pumps (20/hour) to perform this afternoon/evening to increase circulation, to pt's tolerance and limited by pain. PT to progress mobility as tolerated, and will continue to follow acutely.        Follow Up Recommendations Follow surgeon's recommendation for DC plan and follow-up therapies;Supervision for mobility/OOB    Equipment Recommendations  None recommended by PT    Recommendations for Other Services       Precautions / Restrictions Precautions Precautions: Fall Restrictions Weight Bearing Restrictions: No Other Position/Activity Restrictions: WBAT       Mobility  Bed Mobility Overal bed mobility: Needs Assistance Bed Mobility: Supine to Sit     Supine to sit: Min assist;HOB elevated     General bed mobility comments: min assist for slow lowering of RLE. increased time and effort.   Transfers Overall transfer level: Needs assistance Equipment used: Rolling walker (2 wheeled) Transfers: Sit to/from Stand Sit to Stand: Min guard;From elevated surface         General transfer comment: Min guard for safety. Verbal cuing for hand placement.   Ambulation/Gait Ambulation/Gait assistance: Min guard Gait Distance (Feet): 30 Feet Assistive device: Rolling walker (2 wheeled) Gait Pattern/deviations: Step-to pattern;Decreased weight shift to right Gait velocity: decr    General Gait Details: min guard for safety. short  step lengths, pt very cautious. Verbal cuing for sequencing, placement in RW, turning.  Stairs            Wheelchair Mobility    Modified Rankin (Stroke Patients Only)       Balance Overall balance assessment: Mild deficits observed, not formally tested                                           Pertinent Vitals/Pain Pain Assessment: 0-10 Pain Score: 2  Pain Descriptors / Indicators: Sore Pain Intervention(s): Limited activity within patient's tolerance;Repositioned;Ice applied;Monitored during session;Premedicated before session    Home Living Family/patient expects to be discharged to:: Private residence Living Arrangements: Alone Available Help at Discharge: Family;Available 24 hours/day(pt's sister is coming to stay with her from [place] until next Friday, January 24th ) Type of Home: House Home Access: Stairs to enter Entrance Stairs-Rails: Left Entrance Stairs-Number of Steps: 3 Home Layout: One level Home Equipment: Walker - 2 wheels;Cane - single point;Bedside commode      Prior Function Level of Independence: Independent               Hand Dominance   Dominant Hand: Right    Extremity/Trunk Assessment   Upper Extremity Assessment Upper Extremity Assessment: Overall WFL for tasks assessed    Lower Extremity Assessment Lower Extremity Assessment: Overall WFL for tasks assessed;RLE deficits/detail RLE Deficits / Details: suspected post-surgical hip weakness; able to perform ankle pumps, quad set, heel slides RLE Sensation: WNL    Cervical / Trunk Assessment Cervical /  Trunk Assessment: Normal  Communication   Communication: No difficulties  Cognition Arousal/Alertness: Awake/alert Behavior During Therapy: WFL for tasks assessed/performed Overall Cognitive Status: Within Functional Limits for tasks assessed                                        General Comments      Exercises     Assessment/Plan     PT Assessment Patient needs continued PT services  PT Problem List Decreased strength;Pain;Decreased activity tolerance;Decreased knowledge of use of DME;Decreased balance;Decreased mobility       PT Treatment Interventions DME instruction;Therapeutic activities;Gait training;Therapeutic exercise;Patient/family education;Balance training;Stair training;Functional mobility training    PT Goals (Current goals can be found in the Care Plan section)  Acute Rehab PT Goals Patient Stated Goal: get back to being active  PT Goal Formulation: With patient Time For Goal Achievement: 06/05/18 Potential to Achieve Goals: Good    Frequency 7X/week   Barriers to discharge        Co-evaluation               AM-PAC PT "6 Clicks" Mobility  Outcome Measure Help needed turning from your back to your side while in a flat bed without using bedrails?: A Little Help needed moving from lying on your back to sitting on the side of a flat bed without using bedrails?: A Little Help needed moving to and from a bed to a chair (including a wheelchair)?: A Little Help needed standing up from a chair using your arms (e.g., wheelchair or bedside chair)?: A Little Help needed to walk in hospital room?: A Little Help needed climbing 3-5 steps with a railing? : A Little 6 Click Score: 18    End of Session Equipment Utilized During Treatment: Gait belt Activity Tolerance: Patient tolerated treatment well Patient left: in chair;with chair alarm set;with call bell/phone within reach;with SCD's reapplied Nurse Communication: Mobility status PT Visit Diagnosis: Other abnormalities of gait and mobility (R26.89);Difficulty in walking, not elsewhere classified (R26.2)    Time: [id number] PT Time Calculation (min) (ACUTE ONLY): 17 min   Charges:   PT Evaluation $PT Eval Low Complexity: 1 Low         Abhinav Mayorquin Terrial Rhodes Diem Pagnotta, PT Acute Rehabilitation Services Pager [phone]  Office [phone]  Zaul Hubers D  Miesha Bachmann 05/29/2018, 7:01 PM

## 2018-05-29 NOTE — Anesthesia Procedure Notes (Signed)
Spinal  Patient location during procedure: OR Start time: 05/29/2018 11:20 AM End time: 05/29/2018 11:30 AM Staffing Anesthesiologist: Leonides Grills, MD Performed: anesthesiologist  Preanesthetic Checklist Completed: patient identified, surgical consent, pre-op evaluation, timeout performed, IV checked, risks and benefits discussed and monitors and equipment checked Spinal Block Patient position: sitting Prep: DuraPrep Patient monitoring: cardiac monitor, continuous pulse ox and blood pressure Approach: midline Location: L4-5 Injection technique: single-shot Needle Needle type: Pencan  Needle gauge: 24 G Needle length: 9 cm Assessment Sensory level: T10 Additional Notes Functioning IV was confirmed and monitors were applied. Sterile prep and drape, including hand hygiene and sterile gloves were used. The patient was positioned and the spine was prepped. The skin was anesthetized with lidocaine. Previous unsuccessful attempt by CRNA.  Free flow of clear CSF was obtained prior to injecting local anesthetic into the CSF.  The spinal needle aspirated freely following injection.  The needle was carefully withdrawn.  The patient tolerated the procedure well.

## 2018-05-29 NOTE — Anesthesia Postprocedure Evaluation (Signed)
Anesthesia Post Note  Patient: Kayla Bridges  Procedure(s) Performed: TOTAL HIP ARTHROPLASTY ANTERIOR APPROACH (Right )     Patient location during evaluation: PACU Anesthesia Type: Spinal Level of consciousness: oriented and awake and alert Pain management: pain level controlled Vital Signs Assessment: post-procedure vital signs reviewed and stable Respiratory status: spontaneous breathing, respiratory function stable and patient connected to nasal cannula oxygen Cardiovascular status: blood pressure returned to baseline and stable Postop Assessment: no headache, no backache and no apparent nausea or vomiting Anesthetic complications: no    Last Vitals:  Vitals:   05/29/18 1535 05/29/18 1634  BP: 115/64 128/80  Pulse: 80 91  Resp: 16 18  Temp: (!) 36.3 C   SpO2: 95% 95%    Last Pain:  Vitals:   05/29/18 1535  TempSrc: Oral  PainSc:                  Ryan P Ellender

## 2018-05-29 NOTE — Discharge Instructions (Signed)
°Dr. Sharrod Achille °Joint Replacement Specialist °[facility] °[address] °[place],  [address] °[phone] ° ° °TOTAL HIP REPLACEMENT POSTOPERATIVE DIRECTIONS ° ° ° °Hip Rehabilitation, Guidelines Following Surgery  ° °WEIGHT BEARING °Weight bearing as tolerated with assist device (walker, cane, etc) as directed, use it as long as suggested by your surgeon or therapist, typically at least 4-6 weeks. ° °The results of a hip operation are greatly improved after range of motion and muscle strengthening exercises. Follow all safety measures which are given to protect your hip. If any of these exercises cause increased pain or swelling in your joint, decrease the amount until you are comfortable again. Then slowly increase the exercises. Call your caregiver if you have problems or questions.  ° °HOME CARE INSTRUCTIONS  °Most of the following instructions are designed to prevent the dislocation of your new hip.  °Remove items at home which could result in a fall. This includes throw rugs or furniture in walking pathways.  °Continue medications as instructed at time of discharge. °· You may have some home medications which will be placed on hold until you complete the course of blood thinner medication. °· You may start showering once you are discharged home. Do not remove your dressing. °Do not put on socks or shoes without following the instructions of your caregivers.   °Sit on chairs with arms. Use the chair arms to help push yourself up when arising.  °Arrange for the use of a toilet seat elevator so you are not sitting low.  °· Walk with walker as instructed.  °You may resume a sexual relationship in one month or when given the OK by your caregiver.  °Use walker as long as suggested by your caregivers.  °You may put full weight on your legs and walk as much as is comfortable. °Avoid periods of inactivity such as sitting longer than an hour when not asleep. This helps prevent  blood clots.  °You may return to work once you are cleared by your surgeon.  °Do not drive a car for 6 weeks or until released by your surgeon.  °Do not drive while taking narcotics.  °Wear elastic stockings for two weeks following surgery during the day but you may remove then at night.  °Make sure you keep all of your appointments after your operation with all of your doctors and caregivers. You should call the office at the above phone number and make an appointment for approximately two weeks after the date of your surgery. °Please pick up a stool softener and laxative for home use as long as you are requiring pain medications. °· ICE to the affected hip every three hours for 30 minutes at a time and then as needed for pain and swelling. Continue to use ice on the hip for pain and swelling from surgery. You may notice swelling that will progress down to the foot and ankle.  This is normal after surgery.  Elevate the leg when you are not up walking on it.   °It is important for you to complete the blood thinner medication as prescribed by your doctor. °· Continue to use the breathing machine which will help keep your temperature down.  It is common for your temperature to cycle up and down following surgery, especially at night when you are not up moving around and exerting yourself.  The breathing machine keeps your lungs expanded and your temperature down. ° °RANGE OF MOTION AND STRENGTHENING EXERCISES  °These exercises are   designed to help you keep full movement of your hip joint. Follow your caregiver's or physical therapist's instructions. Perform all exercises about fifteen times, three times per day or as directed. Exercise both hips, even if you have had only one joint replacement. These exercises can be done on a training (exercise) mat, on the floor, on a table or on a bed. Use whatever works the best and is most comfortable for you. Use music or television while you are exercising so that the exercises  are a pleasant break in your day. This will make your life better with the exercises acting as a break in routine you can look forward to.  °Lying on your back, slowly slide your foot toward your buttocks, raising your knee up off the floor. Then slowly slide your foot back down until your leg is straight again.  °Lying on your back spread your legs as far apart as you can without causing discomfort.  °Lying on your side, raise your upper leg and foot straight up from the floor as far as is comfortable. Slowly lower the leg and repeat.  °Lying on your back, tighten up the muscle in the front of your thigh (quadriceps muscles). You can do this by keeping your leg straight and trying to raise your heel off the floor. This helps strengthen the largest muscle supporting your knee.  °Lying on your back, tighten up the muscles of your buttocks both with the legs straight and with the knee bent at a comfortable angle while keeping your heel on the floor.  ° °SKILLED REHAB INSTRUCTIONS: °If the patient is transferred to a skilled rehab facility following release from the hospital, a list of the current medications will be sent to the facility for the patient to continue.  When discharged from the skilled rehab facility, please have the facility set up the patient's Home Health Physical Therapy prior to being released. Also, the skilled facility will be responsible for providing the patient with their medications at time of release from the facility to include their pain medication and their blood thinner medication. If the patient is still at the rehab facility at time of the two week follow up appointment, the skilled rehab facility will also need to assist the patient in arranging follow up appointment in our office and any transportation needs. ° °MAKE SURE YOU:  °Understand these instructions.  °Will watch your condition.  °Will get help right away if you are not doing well or get worse. ° °Pick up stool softner and  laxative for home use following surgery while on pain medications. °Do not remove your dressing. °The dressing is waterproof--it is OK to take showers. °Continue to use ice for pain and swelling after surgery. °Do not use any lotions or creams on the incision until instructed by your surgeon. °Total Hip Protocol. ° ° °

## 2018-05-30 ENCOUNTER — Encounter (HOSPITAL_COMMUNITY): Payer: Self-pay | Admitting: Orthopedic Surgery

## 2018-05-30 LAB — BASIC METABOLIC PANEL
Anion gap: 8 (ref 5–15)
BUN: 17 mg/dL (ref 8–23)
CHLORIDE: 108 mmol/L (ref 98–111)
CO2: 26 mmol/L (ref 22–32)
Calcium: 8.5 mg/dL — ABNORMAL LOW (ref 8.9–10.3)
Creatinine, Ser: 0.79 mg/dL (ref 0.44–1.00)
GFR calc non Af Amer: >60 mL/min (ref >60)
Glucose, Bld: 152 mg/dL — ABNORMAL HIGH (ref 70–99)
Potassium: 5.2 mmol/L — ABNORMAL HIGH (ref 3.5–5.1)
SODIUM: 142 mmol/L (ref 135–145)

## 2018-05-30 LAB — CBC
HCT: 32.1 % — ABNORMAL LOW (ref 36.0–46.0)
Hemoglobin: 10.2 g/dL — ABNORMAL LOW (ref 12.0–15.0)
MCH: 30.4 pg (ref 26.0–34.0)
MCHC: 31.8 g/dL (ref 30.0–36.0)
MCV: 95.8 fL (ref 80.0–100.0)
Platelets: 246 10*3/uL (ref 150–400)
RBC: 3.35 MIL/uL — ABNORMAL LOW (ref 3.87–5.11)
RDW: 11.5 % (ref 11.5–15.5)
WBC: 15.6 10*3/uL — ABNORMAL HIGH (ref 4.0–10.5)
nRBC: 0 % (ref 0.0–0.2)

## 2018-05-30 MED ORDER — HYDROCODONE-ACETAMINOPHEN 5-325 MG PO TABS: 1.0000 | ORAL_TABLET | Freq: Four times a day (QID) | ORAL | 0 refills | Status: DC | PRN

## 2018-05-30 MED ORDER — ONDANSETRON HCL 4 MG PO TABS: 4.0000 mg | ORAL_TABLET | Freq: Four times a day (QID) | ORAL | 0 refills | Status: DC | PRN

## 2018-05-30 MED ORDER — SENNA 8.6 MG PO TABS: 2.0000 | ORAL_TABLET | Freq: Every day | ORAL | 1 refills | Status: DC

## 2018-05-30 MED ORDER — METHOCARBAMOL 500 MG PO TABS: 500.0000 mg | ORAL_TABLET | Freq: Four times a day (QID) | ORAL | 0 refills | Status: DC | PRN

## 2018-05-30 MED ORDER — ASPIRIN 81 MG PO CHEW: 81.0000 mg | CHEWABLE_TABLET | Freq: Two times a day (BID) | ORAL | 1 refills | Status: AC

## 2018-05-30 NOTE — Progress Notes (Signed)
Physical Therapy Treatment Patient Details Name: Kayla Bridges MRN: [id number] DOB: 1951/01/04 Today's Date: 05/30/2018    History of Present Illness 68 yo female s/p R DA-THA on 05/29/18. PMH includes L THA, OA, GERD, HTN, R TKR.     PT Comments    Pt continues to progress well with mobility.  Pt reviewed car transfers, stairs and home therex program with written instruction provided.   Follow Up Recommendations  Follow surgeon's recommendation for DC plan and follow-up therapies;Supervision for mobility/OOB     Equipment Recommendations  None recommended by PT    Recommendations for Other Services       Precautions / Restrictions Precautions Precautions: Fall Restrictions Weight Bearing Restrictions: No Other Position/Activity Restrictions: WBAT     Mobility  Bed Mobility Overal bed mobility: Needs Assistance Bed Mobility: Supine to Sit     Supine to sit: Min guard     General bed mobility comments: Pt up in chair and requests back to same  Transfers Overall transfer level: Needs assistance Equipment used: Rolling walker (2 wheeled) Transfers: Sit to/from Stand Sit to Stand: Supervision         General transfer comment: cues for LE management and use of UEs to self assist  Ambulation/Gait Ambulation/Gait assistance: Min guard;Supervision Gait Distance (Feet): 100 Feet Assistive device: Rolling walker (2 wheeled) Gait Pattern/deviations: Step-to pattern;Step-through pattern;Decreased step length - right;Decreased step length - left;Shuffle;Trunk flexed Gait velocity: decr    General Gait Details: cues for sequence, posture and position from RW   Stairs Stairs: Yes Stairs assistance: Min assist Stair Management: Step to pattern;One rail Left;Forwards;With cane Number of Stairs: 5 General stair comments: cues for sequence and foot/cane placement   Wheelchair Mobility    Modified Rankin (Stroke Patients Only)       Balance Overall balance  assessment: Mild deficits observed, not formally tested                                          Cognition Arousal/Alertness: Awake/alert Behavior During Therapy: WFL for tasks assessed/performed Overall Cognitive Status: Within Functional Limits for tasks assessed                                        Exercises Total Joint Exercises Ankle Circles/Pumps: AROM;Both;15 reps;Supine Quad Sets: AROM;Both;10 reps;Supine Heel Slides: AAROM;Right;Supine;10 reps Hip ABduction/ADduction: AAROM;Right;15 reps;Supine Long Arc Quad: AROM;Right;10 reps;Seated    General Comments        Pertinent Vitals/Pain Pain Assessment: 0-10 Pain Score: 4  Pain Location: R hip Pain Descriptors / Indicators: Sore;Aching Pain Intervention(s): Limited activity within patient's tolerance;Monitored during session;Ice applied    Home Living                      Prior Function            PT Goals (current goals can now be found in the care plan section) Acute Rehab PT Goals Patient Stated Goal: get back to being active  PT Goal Formulation: With patient Time For Goal Achievement: 06/05/18 Potential to Achieve Goals: Good Progress towards PT goals: Progressing toward goals    Frequency    7X/week      PT Plan Current plan remains appropriate    Co-evaluation  AM-PAC PT "6 Clicks" Mobility   Outcome Measure  Help needed turning from your back to your side while in a flat bed without using bedrails?: A Little Help needed moving from lying on your back to sitting on the side of a flat bed without using bedrails?: A Little Help needed moving to and from a bed to a chair (including a wheelchair)?: A Little Help needed standing up from a chair using your arms (e.g., wheelchair or bedside chair)?: A Little Help needed to walk in hospital room?: A Little Help needed climbing 3-5 steps with a railing? : A Little 6 Click Score: 18     End of Session Equipment Utilized During Treatment: Gait belt Activity Tolerance: Patient tolerated treatment well Patient left: in chair;with chair alarm set;with call bell/phone within reach;with SCD's reapplied Nurse Communication: Mobility status PT Visit Diagnosis: Other abnormalities of gait and mobility (R26.89);Difficulty in walking, not elsewhere classified (R26.2)     Time: 1779-3903 PT Time Calculation (min) (ACUTE ONLY): 28 min  Charges:  $Gait Training: 8-22 mins $Therapeutic Exercise: 8-22 mins                     Mauro Kaufmann PT Acute Rehabilitation Services Pager [phone] Office [phone]    Asya Derryberry 05/30/2018, 12:40 PM

## 2018-05-30 NOTE — Care Management Note (Signed)
Case Management Note  Patient Details  Name: Kayla Bridges MRN: [id number] Date of Birth: July 21, 1950  Subjective/Objective:                    Action/Plan:Pt will do Home Exercise and follow up with MD.  Pt has DME.    Expected Discharge Date:  05/30/18               Expected Discharge Plan:  Home/Self Care  In-House Referral:     Discharge planning Services  CM Consult  Post Acute Care Choice:    Choice offered to:     DME Arranged:    DME Agency:     HH Arranged:    HH Agency:     Status of Service:  Completed, signed off  If discussed at [employer] of Stay Meetings, dates discussed:    Additional CommentsGeni Bers, RN 05/30/2018, 12:19 PM

## 2018-05-30 NOTE — Progress Notes (Signed)
Physical Therapy Treatment Patient Details Name: Kayla Bridges MRN: [id number] DOB: 04/08/1951 Today's Date: 05/30/2018    History of Present Illness 68 yo female s/p R DA-THA on 05/29/18. PMH includes L THA, OA, GERD, HTN, R TKR.     PT Comments    Pt progressing well with mobility and hopeful for dc home this pm.   Follow Up Recommendations  Follow surgeon's recommendation for DC plan and follow-up therapies;Supervision for mobility/OOB     Equipment Recommendations  None recommended by PT    Recommendations for Other Services       Precautions / Restrictions Precautions Precautions: Fall Restrictions Weight Bearing Restrictions: No Other Position/Activity Restrictions: WBAT     Mobility  Bed Mobility Overal bed mobility: Needs Assistance Bed Mobility: Supine to Sit     Supine to sit: Min guard     General bed mobility comments: cues for sequence and use of L LE to self assist  Transfers Overall transfer level: Needs assistance Equipment used: Rolling walker (2 wheeled) Transfers: Sit to/from Stand Sit to Stand: Min guard         General transfer comment: Min guard for safety. Verbal cuing for hand placement.   Ambulation/Gait Ambulation/Gait assistance: Min guard Gait Distance (Feet): 130 Feet Assistive device: Rolling walker (2 wheeled) Gait Pattern/deviations: Step-to pattern;Step-through pattern;Decreased step length - right;Decreased step length - left;Shuffle;Trunk flexed Gait velocity: decr    General Gait Details: cues for sequence, posture and position from [employer]             Wheelchair Mobility    Modified Rankin (Stroke Patients Only)       Balance Overall balance assessment: Mild deficits observed, not formally tested                                          Cognition Arousal/Alertness: Awake/alert Behavior During Therapy: WFL for tasks assessed/performed Overall Cognitive Status: Within  Functional Limits for tasks assessed                                        Exercises Total Joint Exercises Ankle Circles/Pumps: AROM;Both;15 reps;Supine Quad Sets: AROM;Both;10 reps;Supine Heel Slides: AAROM;Right;20 reps;Supine Hip ABduction/ADduction: AAROM;Right;15 reps;Supine    General Comments        Pertinent Vitals/Pain Pain Assessment: 0-10 Pain Score: 2  Pain Location: R hip Pain Descriptors / Indicators: Sore Pain Intervention(s): Limited activity within patient's tolerance;Monitored during session;Premedicated before session;Ice applied    Home Living                      Prior Function            PT Goals (current goals can now be found in the care plan section) Acute Rehab PT Goals Patient Stated Goal: get back to being active  PT Goal Formulation: With patient Time For Goal Achievement: 06/05/18 Potential to Achieve Goals: Good Progress towards PT goals: Progressing toward goals    Frequency    7X/week      PT Plan Current plan remains appropriate    Co-evaluation              AM-PAC PT "6 Clicks" Mobility   Outcome Measure  Help needed turning from your back to your side while  in a flat bed without using bedrails?: A Little Help needed moving from lying on your back to sitting on the side of a flat bed without using bedrails?: A Little Help needed moving to and from a bed to a chair (including a wheelchair)?: A Little Help needed standing up from a chair using your arms (e.g., wheelchair or bedside chair)?: A Little Help needed to walk in hospital room?: A Little Help needed climbing 3-5 steps with a railing? : A Little 6 Click Score: 18    End of Session Equipment Utilized During Treatment: Gait belt Activity Tolerance: Patient tolerated treatment well Patient left: in chair;with chair alarm set;with call bell/phone within reach;with SCD's reapplied Nurse Communication: Mobility status PT Visit Diagnosis:  Other abnormalities of gait and mobility (R26.89);Difficulty in walking, not elsewhere classified (R26.2)     Time: [id number] PT Time Calculation (min) (ACUTE ONLY): 28 min  Charges:  $Gait Training: 8-22 mins $Therapeutic Exercise: 8-22 mins                     Kayla KaufmannHunter Flora Bridges PT Acute Rehabilitation Services Pager [phone] Office [phone]    Kayla Bridges 05/30/2018, 12:35 PM

## 2018-05-30 NOTE — Progress Notes (Signed)
    Subjective:  Patient reports pain as mild to moderate.  Denies N/V/CP/SOB. No c/o.  Objective:   VITALS:   Vitals:   05/29/18 2138 05/30/18 0146 05/30/18 0229 05/30/18 0537  BP: (!) 101/56 (!) 97/55 110/62 106/60  Pulse: 75 67 70 71  Resp: 16 15 16 16   Temp: (!) 97.5 F (36.4 C) (!) 97.5 F (36.4 C) 98.3 F (36.8 C) 97.8 F (36.6 C)  TempSrc:   Oral   SpO2: 91% 97% 97% 94%  Weight:      Height:        NAD ABD soft Sensation intact distally Intact pulses distally Dorsiflexion/Plantar flexion intact Incision: dressing C/D/I Compartment soft   Lab Results  Component Value Date   WBC 15.6 (H) 05/30/2018   HGB 10.2 (L) 05/30/2018   HCT 32.1 (L) 05/30/2018   MCV 95.8 05/30/2018   PLT 246 05/30/2018   BMET    Component Value Date/Time   NA 142 05/30/2018 0448   K 5.2 (H) 05/30/2018 0448   CL 108 05/30/2018 0448   CO2 26 05/30/2018 0448   GLUCOSE 152 (H) 05/30/2018 0448   BUN 17 05/30/2018 0448   CREATININE 0.79 05/30/2018 0448   CALCIUM 8.5 (L) 05/30/2018 0448   GFRNONAA >60 05/30/2018 0448   GFRAA >60 05/30/2018 0448     Assessment/Plan: 1 Day Post-Op   Principal Problem:   Osteoarthritis of right hip   WBAT with walker DVT ppx: Aspirin, SCDs, TEDS PO pain control PT/OT Dispo: D/C home with HEP   Iline Oven Shahida Schnackenberg 05/30/2018, 7:49 AM   Samson Frederic, MD Cell: [phone] [facility] is now [facility]  [place] [address], [place], [place] [address] Phone: [phone] [email or web address] Facebook  [employer]

## 2018-05-30 NOTE — Discharge Summary (Signed)
Physician Discharge Summary  Patient ID: Kayla Bridges MRN: [id number] DOB/AGE: 12/05/1950 68 y.o.  Admit date: 05/29/2018 Discharge date: 05/30/2018  Admission Diagnoses:  Osteoarthritis of right hip  Discharge Diagnoses:  Principal Problem:   Osteoarthritis of right hip   Past Medical History:  Diagnosis Date  . Arthritis    osteoarthritis left hip  . Elevated cholesterol    patient denies   . GERD (gastroesophageal reflux disease)   . HPV in female   . Hypertension     Surgeries: Procedure(s): TOTAL HIP ARTHROPLASTY ANTERIOR APPROACH on 05/29/2018   Consultants (if any):   Discharged Condition: Improved  Hospital Course: Kayla Bridges is an 68 y.o. female who was admitted 05/29/2018 with a diagnosis of Osteoarthritis of right hip and went to the operating room on 05/29/2018 and underwent the above named procedures.    She was given perioperative antibiotics:  Anti-infectives (From admission, onward)   Start     Dose/Rate Route Frequency Ordered Stop   05/29/18 1730  ceFAZolin (ANCEF) IVPB 2g/100 mL premix     2 g 200 mL/hr over 30 Minutes Intravenous Every 6 hours 05/29/18 1516 05/30/18 0000   05/29/18 0830  ceFAZolin (ANCEF) IVPB 2g/100 mL premix     2 g 200 mL/hr over 30 Minutes Intravenous On call to O.R. 05/29/18 6073 05/29/18 1135    .  She was given sequential compression devices, early ambulation, and ASA for DVT prophylaxis.  She benefited maximally from the hospital stay and there were no complications.    Recent vital signs:  Vitals:   05/30/18 0537 05/30/18 0954  BP: 106/60 (!) 123/55  Pulse: 71 79  Resp: 16 16  Temp: 97.8 F (36.6 C) 97.7 F (36.5 C)  SpO2: 94% 93%    Recent laboratory studies:  Lab Results  Component Value Date   HGB 10.2 (L) 05/30/2018   HGB 13.8 05/22/2018   HGB 10.9 (L) 08/18/2014   Lab Results  Component Value Date   WBC 15.6 (H) 05/30/2018   PLT 246 05/30/2018   Lab Results  Component Value Date   INR  0.98 08/10/2014   Lab Results  Component Value Date   NA 142 05/30/2018   K 5.2 (H) 05/30/2018   CL 108 05/30/2018   CO2 26 05/30/2018   BUN 17 05/30/2018   CREATININE 0.79 05/30/2018   GLUCOSE 152 (H) 05/30/2018    Discharge Medications:   Allergies as of 05/30/2018      Reactions   Prinivil [lisinopril] Swelling, Rash      Medication List    STOP taking these medications   aspirin EC 81 MG tablet Replaced by:  aspirin 81 MG chewable tablet   docusate sodium 100 MG capsule Commonly known as:  COLACE   ferrous sulfate 325 (65 FE) MG tablet   HYDROcodone-acetaminophen 7.5-325 MG tablet Commonly known as:  NORCO Replaced by:  HYDROcodone-acetaminophen 5-325 MG tablet   polyethylene glycol packet Commonly known as:  MIRALAX / GLYCOLAX     TAKE these medications   aspirin 81 MG chewable tablet Chew 1 tablet (81 mg total) by mouth 2 (two) times daily. Replaces:  aspirin EC 81 MG tablet   CALCIUM CARBONATE-VITAMIN D PO Take 1 tablet by mouth daily with breakfast.   hydrochlorothiazide 25 MG tablet Commonly known as:  HYDRODIURIL Take 25 mg by mouth every morning.   HYDROcodone-acetaminophen 5-325 MG tablet Commonly known as:  NORCO/VICODIN Take 1-2 tablets by mouth every 6 (six) hours as  needed for moderate pain (pain score 4-6). Replaces:  HYDROcodone-acetaminophen 7.5-325 MG tablet   irbesartan 300 MG tablet Commonly known as:  AVAPRO Take 150 mg by mouth every morning.   methocarbamol 500 MG tablet Commonly known as:  ROBAXIN Take 1 tablet (500 mg total) by mouth every 6 (six) hours as needed for muscle spasms.   omeprazole 40 MG capsule Commonly known as:  PRILOSEC Take 40 mg by mouth daily.   ondansetron 4 MG tablet Commonly known as:  ZOFRAN Take 1 tablet (4 mg total) by mouth every 6 (six) hours as needed for nausea.   senna 8.6 MG Tabs tablet Commonly known as:  SENOKOT Take 2 tablets (17.2 mg total) by mouth at bedtime.   TURMERIC PO Take  200 mg by mouth daily.   vitamin B-12 1000 MCG tablet Commonly known as:  CYANOCOBALAMIN Take 1,000 mcg by mouth daily.   Vitamin D 50 MCG (2000 UT) Caps Take 2,000 Units by mouth daily.       Diagnostic Studies: Dg Pelvis Portable  Result Date: 05/29/2018 CLINICAL DATA:  Postop day 0 ANTERIOR approach RIGHT total hip arthroplasty. EXAM: PORTABLE PELVIS 1-2 VIEWS COMPARISON:  Intraoperative RIGHT hip x-rays earlier same day. MRI of the hips 01/24/2018. AP pelvis 08/17/2014. FINDINGS: Anatomic alignment of the RIGHT hip prosthesis in the AP projection post RIGHT total hip arthroplasty. No complicating features. Previously placed LEFT hip prosthesis anatomically aligned in the AP projection without complicating features. IMPRESSION: Anatomic alignment post RIGHT total hip arthroplasty without acute complicating features. Electronically Signed   By: Hulan Saashomas  Lawrence M.D.   On: 05/29/2018 13:59   Dg C-arm 1-60 Min-no Report  Result Date: 05/29/2018 Fluoroscopy was utilized by the requesting physician.  No radiographic interpretation.   Dg Hip Operative Unilat With Pelvis Right  Result Date: 05/29/2018 CLINICAL DATA:  Total hip replacement. EXAM: OPERATIVE RIGHT HIP (WITH PELVIS IF PERFORMED) to VIEWS TECHNIQUE: Fluoroscopic spot image(s) were submitted for interpretation post-operatively. COMPARISON:  MRI 01/24/2018. FINDINGS: Total right hip replacement. Hardware intact. Anatomic alignment. Prior total left hip replacement. IMPRESSION: Total right hip replacement anatomic alignment. Electronically Signed   By: Maisie Fushomas  Register   On: 05/29/2018 13:41    Disposition:    Discharge Instructions    Call MD / Call 911   Complete by:  As directed    If you experience chest pain or shortness of breath, CALL 911 and be transported to the hospital emergency room.  If you develope a fever above 101 F, pus (white drainage) or increased drainage or redness at the wound, or calf pain, call your  surgeon's office.   Constipation Prevention   Complete by:  As directed    Drink plenty of fluids.  Prune juice may be helpful.  You may use a stool softener, such as Colace (over the counter) 100 mg twice a day.  Use MiraLax (over the counter) for constipation as needed.   Diet - low sodium heart healthy   Complete by:  As directed    Driving restrictions   Complete by:  As directed    No driving for 6 weeks   Increase activity slowly as tolerated   Complete by:  As directed    Lifting restrictions   Complete by:  As directed    No lifting for 6 weeks   TED hose   Complete by:  As directed    Use stockings (TED hose) for 2 weeks on both leg(s).  You may remove them  at night for sleeping.      Follow-up Information    Shawntrice Salle, MD. Schedule an appointment as soon as possible for a visit in 2 wArlys Johneeks.   Specialty:  Orthopedic Surgery Why:  For wound re-check Contact information: [address] [place] [address] [phone]            Signed: Iline OvenBrian J Rameen Quinney 05/31/2018, 9:41 PM

## 2018-12-03 ENCOUNTER — Other Ambulatory Visit: Payer: Self-pay | Admitting: Family Medicine

## 2018-12-03 DIAGNOSIS — Z1231 Encounter for screening mammogram for malignant neoplasm of breast: Secondary | ICD-10-CM

## 2019-01-15 ENCOUNTER — Ambulatory Visit
Admission: RE | Admit: 2019-01-15 | Discharge: 2019-01-15 | Disposition: A | Discharge status: Home / Self Care | Payer: Medicare Other | Source: Ambulatory Visit | Attending: Family Medicine | Admitting: Family Medicine

## 2019-01-15 ENCOUNTER — Other Ambulatory Visit: Payer: Self-pay

## 2019-01-15 DIAGNOSIS — Z1231 Encounter for screening mammogram for malignant neoplasm of breast: Secondary | ICD-10-CM

## 2019-05-20 ENCOUNTER — Other Ambulatory Visit: Payer: Self-pay | Admitting: Family Medicine

## 2019-05-20 DIAGNOSIS — E2839 Other primary ovarian failure: Secondary | ICD-10-CM

## 2019-06-21 ENCOUNTER — Ambulatory Visit: Payer: Medicare Other | Attending: Internal Medicine

## 2019-06-21 DIAGNOSIS — Z23 Encounter for immunization: Secondary | ICD-10-CM

## 2019-06-21 NOTE — Progress Notes (Signed)
   Covid-19 Vaccination Clinic  Name:  Kayla Bridges    MRN: [id number] DOB: November 12, 1950  06/21/2019  [name] was observed post Covid-19 immunization for 15 minutes without incidence. She was provided with Vaccine Information Sheet and instruction to access the V-Safe system.   [name] was instructed to call 911 with any severe reactions post vaccine: Marland Kitchen Difficulty breathing  . Swelling of your face and throat  . A fast heartbeat  . A bad rash all over your body  . Dizziness and weakness    Immunizations Administered    Name Date Dose VIS Date Route   Pfizer COVID-19 Vaccine 06/21/2019  9:57 AM 0.3 mL 04/24/2019 Intramuscular   Manufacturer: [employer], [employer]   Lot: [id number]   NDC: [address]-2

## 2019-07-15 ENCOUNTER — Ambulatory Visit: Payer: Medicare Other | Attending: Internal Medicine

## 2019-07-15 DIAGNOSIS — Z23 Encounter for immunization: Secondary | ICD-10-CM

## 2019-07-15 NOTE — Progress Notes (Signed)
   Covid-19 Vaccination Clinic  Name:  MYRKA SYLVA    MRN: [id number] DOB: 1951-03-09  07/15/2019  Ms. Maietta was observed post Covid-19 immunization for 15 minutes without incident. She was provided with Vaccine Information Sheet and instruction to access the V-Safe system.   Ms. Hudson was instructed to call 911 with any severe reactions post vaccine: Marland Kitchen Difficulty breathing  . Swelling of face and throat  . A fast heartbeat  . A bad rash all over body  . Dizziness and weakness   Immunizations Administered    Name Date Dose VIS Date Route   Pfizer COVID-19 Vaccine 07/15/2019  4:27 PM 0.3 mL 04/24/2019 Intramuscular   Manufacturer: [employer], [employer]   Lot: [id number]   NDC: [address]-9

## 2019-08-07 ENCOUNTER — Ambulatory Visit
Admission: RE | Admit: 2019-08-07 | Discharge: 2019-08-07 | Disposition: A | Discharge status: Home / Self Care | Payer: Medicare Other | Source: Ambulatory Visit | Attending: Family Medicine | Admitting: Family Medicine

## 2019-08-07 ENCOUNTER — Other Ambulatory Visit: Payer: Self-pay

## 2019-08-07 DIAGNOSIS — E2839 Other primary ovarian failure: Secondary | ICD-10-CM

## 2019-12-16 ENCOUNTER — Other Ambulatory Visit: Payer: Self-pay | Admitting: Family Medicine

## 2019-12-16 DIAGNOSIS — Z1231 Encounter for screening mammogram for malignant neoplasm of breast: Secondary | ICD-10-CM

## 2020-01-19 ENCOUNTER — Other Ambulatory Visit: Payer: Self-pay

## 2020-01-19 ENCOUNTER — Ambulatory Visit
Admission: RE | Admit: 2020-01-19 | Discharge: 2020-01-19 | Disposition: A | Discharge status: Home / Self Care | Payer: Medicare Other | Source: Ambulatory Visit | Attending: Family Medicine | Admitting: Family Medicine

## 2020-01-19 DIAGNOSIS — Z1231 Encounter for screening mammogram for malignant neoplasm of breast: Secondary | ICD-10-CM

## 2020-12-07 ENCOUNTER — Other Ambulatory Visit: Payer: Self-pay | Admitting: Family Medicine

## 2020-12-07 DIAGNOSIS — Z1231 Encounter for screening mammogram for malignant neoplasm of breast: Secondary | ICD-10-CM

## 2020-12-20 DIAGNOSIS — Z1231 Encounter for screening mammogram for malignant neoplasm of breast: Secondary | ICD-10-CM

## 2021-01-31 ENCOUNTER — Other Ambulatory Visit: Payer: Self-pay

## 2021-01-31 ENCOUNTER — Ambulatory Visit
Admission: RE | Admit: 2021-01-31 | Discharge: 2021-01-31 | Disposition: A | Discharge status: Home / Self Care | Payer: Medicare Other | Source: Ambulatory Visit

## 2021-01-31 DIAGNOSIS — Z1231 Encounter for screening mammogram for malignant neoplasm of breast: Secondary | ICD-10-CM

## 2021-03-08 ENCOUNTER — Encounter: Payer: Self-pay | Admitting: Orthopaedic Surgery

## 2021-03-08 ENCOUNTER — Ambulatory Visit (INDEPENDENT_AMBULATORY_CARE_PROVIDER_SITE_OTHER): Payer: Medicare Other | Admitting: Orthopaedic Surgery

## 2021-03-08 ENCOUNTER — Other Ambulatory Visit: Payer: Self-pay

## 2021-03-08 DIAGNOSIS — M545 Low back pain, unspecified: Secondary | ICD-10-CM

## 2021-03-08 DIAGNOSIS — G8929 Other chronic pain: Secondary | ICD-10-CM | POA: Diagnosis not present

## 2021-03-08 NOTE — Progress Notes (Addendum)
Office Visit Note   Patient: Kayla Bridges           Date of Birth: 05-Jun-1950           MRN: [id number] Visit Date: 03/08/2021              Requested by: Dois Davenport, MD [address] [place] [address] [place],  [place] [address] PCP: Dois Davenport, MD   Assessment & Plan: Visit Diagnoses: No diagnosis found.  Plan: Patient is here for a second opinion she is a 70 year old woman who is status post left hip replacement in 2016 and right hip replacement in 2020 done by Dr. Linna Caprice at [facility].  She states she did fairly well after the left hip replacement in 2016.  When she had her right hip replacement in 2020 she did not have much physical therapy because of COVID.  She began experiencing lateral hip pain which was thought to be trochanteric bursitis.  This has been injected twice and while she is gotten some relief the pain is come back.  She is now also also complaining of lower back pain sometimes in the buttock.  She denies any radicular findings however she does have difficulty ambulating long distances because her right leg gets significantly fatigued and feels like it is going to "give out ".  She denies any loss of bowel or bladder control.  She has tried physical therapy before and it seemed to help her.  She is currently completed 2 visits of physical therapy and is going to continue this.  Findings on her spine x-ray suggest degenerative changes.  Her hip x-rays of her replacements actually look quite good.  We discussed with her that certainly she could go through with physical therapy and as she has had good success with this in the past can see how she does.  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  She would prefer to go forward with an MRI to define this.  She will continue with physical therapy in the meantime.  Follow-Up  Instructions: No follow-ups on file.   Orders:  No orders of the defined types were placed in this encounter.  No orders of the defined types were placed in this encounter.     Procedures: No procedures performed   Clinical Data: No additional findings.   Subjective: Chief Complaint  Patient presents with   Left Hip - Pain   Right Hip - Pain   Lower Back - Pain  Patient presents today for bilateral hip pain and lower back pain. She has been having hip pain for a year, and lower back pain for two months. She said that her right leg gives out, pain worse with walking. Her pain is located at the lateral right hip and left groin area. She has a history of bilateral hip replacements with Dr.Swinteck. She has been to [facility] and had x-rays taken. She has also seen Dr.Brooks for her lower back. She has started physical therapy. She takes Tylenol for pain relief.     Review of Systems  All other systems reviewed and are negative.   Objective: Vital Signs: There were no vitals taken for this visit.  Physical Exam Constitutional:      Appearance: Normal appearance.  HENT:     Head: Atraumatic.  Eyes:  Extraocular Movements: Extraocular movements intact.  Pulmonary:     Effort: Pulmonary effort is normal.  Skin:    General: Skin is warm and dry.  Neurological:     General: No focal deficit present.     Mental Status: She is alert and oriented to person, place, and time.  Psychiatric:        Mood and Affect: Mood normal.        Behavior: Behavior normal.    Ortho Exam Examination of her lower back.  She does not have any palpable abnormalities.  She has para vertebral tenderness with palpation in the lower back area on the right..  There is no surrounding swelling tenderness is focal does not radiate down into her legs.  No tenderness on the left side with palpation.  Strength testing she is 5 out of 5 with dorsiflexion plantarflexion.  Distal sensation is intact.   Distal pulses are intact.  She has no noted muscle wasting of the lower extremity.She has a leg length difference of 3/8 inches with the right being shorter Manipulation of both of her hips is quite good and is painless.  She has mild tenderness over the right trochanteric bursa.  X-rays were reviewed of her hips and lumbar spine.  The lumbar spine has some degenerative changes with narrowing at the l5-S1 level. No SI joint abnormalities at the level of the greater trochnter.  x-rays of her bilateral hips demonstrate findings status post bilateral hip replacements.No signs of wear or loosening  Specialty Comments:  No specialty comments available.  Imaging: No results found.   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  Past Surgical History:  Procedure Laterality Date   ABDOMINAL HYSTERECTOMY     BREAST EXCISIONAL BIOPSY Left    BREAST SURGERY     lumpectomy-benign   CERVICAL BIOPSY  W/ LOOP ELECTRODE EXCISION     TOTAL HIP ARTHROPLASTY Left 08/17/2014   Procedure: LEFT TOTAL HIP ARTHROPLASTY ANTERIOR APPROACH;  Surgeon: Durene Romans, MD;  Location: [facility];  Service: Orthopedics;  Laterality: Left;   TOTAL HIP ARTHROPLASTY Right 05/29/2018   Procedure: TOTAL HIP ARTHROPLASTY ANTERIOR APPROACH;  Surgeon: Samson Frederic, MD;  Location: [facility];  Service: Orthopedics;  Laterality: Right;   TUBAL LIGATION     Social History   Occupational History   Not on file  Tobacco Use   Smoking status: Never   Smokeless tobacco: Never  Substance and Sexual Activity   Alcohol use: Yes    Comment: wine x2 glasses twice weekly   Drug  use: No   Sexual activity: Not Currently

## 2021-03-14 ENCOUNTER — Telehealth: Payer: Self-pay | Admitting: Physical Medicine and Rehabilitation

## 2021-03-16 ENCOUNTER — Ambulatory Visit
Admission: RE | Admit: 2021-03-16 | Discharge: 2021-03-16 | Disposition: A | Discharge status: Home / Self Care | Payer: Medicare Other | Source: Ambulatory Visit | Attending: Physician Assistant | Admitting: Physician Assistant

## 2021-03-16 ENCOUNTER — Other Ambulatory Visit: Payer: Self-pay

## 2021-03-16 VITALS — BP 118/71 | HR 86 | Temp 97.8°F | Resp 18

## 2021-03-16 DIAGNOSIS — J019 Acute sinusitis, unspecified: Secondary | ICD-10-CM

## 2021-03-16 DIAGNOSIS — R42 Dizziness and giddiness: Secondary | ICD-10-CM

## 2021-03-16 MED ORDER — MECLIZINE HCL 12.5 MG PO TABS: 12.5000 mg | ORAL_TABLET | Freq: Three times a day (TID) | ORAL | 0 refills | Status: AC | PRN

## 2021-03-16 MED ORDER — AMOXICILLIN-POT CLAVULANATE 875-125 MG PO TABS: 1.0000 | ORAL_TABLET | Freq: Two times a day (BID) | ORAL | 0 refills | Status: DC

## 2021-03-16 NOTE — ED Triage Notes (Signed)
Pt c/o lt ear fullness, nasal congestion, and dizziness x2wks. States using nasal spray and allergy meds with no relief.

## 2021-03-16 NOTE — ED Provider Notes (Signed)
[name]    CSN: [id number] Arrival date & time: 03/16/21  0951      History   Chief Complaint Chief Complaint  Patient presents with   appt 10 - ear fullness   Dizziness    HPI Kayla Bridges is a 70 y.o. female.   Patient here today for evaluation of bilateral ear fullness and dizziness that she has had for the last 2 weeks.  She reports she has had vertigo in the past and her symptoms are similar to same.  Certain movements seem to make vertigo worse.  She states dizziness was more severe a couple days ago and she did have some nausea and vomiting.  She has tried nasal spray and allergy meds without significant relief.  She denies any fever or chills.  The history is provided by the patient.  Dizziness Associated symptoms: no chest pain, no nausea, no shortness of breath and no vomiting    Past Medical History:  Diagnosis Date   Arthritis    osteoarthritis left hip   Elevated cholesterol    patient denies    GERD (gastroesophageal reflux disease)    HPV in female    Hypertension     Patient Active Problem List   Diagnosis Date Noted   Osteoarthritis of right hip 05/29/2018   Overweight (BMI 25.0-29.9) 08/18/2014   S/P left THA, AA 08/17/2014    Past Surgical History:  Procedure Laterality Date   ABDOMINAL HYSTERECTOMY     BREAST EXCISIONAL BIOPSY Left    BREAST SURGERY     lumpectomy-benign   CERVICAL BIOPSY  W/ LOOP ELECTRODE EXCISION     TOTAL HIP ARTHROPLASTY Left 08/17/2014   Procedure: LEFT TOTAL HIP ARTHROPLASTY ANTERIOR APPROACH;  Surgeon: Durene Romans, MD;  Location: [facility];  Service: Orthopedics;  Laterality: Left;   TOTAL HIP ARTHROPLASTY Right 05/29/2018   Procedure: TOTAL HIP ARTHROPLASTY ANTERIOR APPROACH;  Surgeon: Samson Frederic, MD;  Location: [facility];  Service: Orthopedics;  Laterality: Right;   TUBAL LIGATION      OB History   No obstetric history on file.      Home Medications    Prior to Admission medications    Medication Sig Start Date End Date Taking? Authorizing Provider  amoxicillin-clavulanate (AUGMENTIN) 875-125 MG tablet Take 1 tablet by mouth every 12 (twelve) hours. 03/16/21  Yes Tomi Bamberger, PA-C  meclizine (ANTIVERT) 12.5 MG tablet Take 1 tablet (12.5 mg total) by mouth 3 (three) times daily as needed for dizziness. 03/16/21  Yes Tomi Bamberger, PA-C  aspirin 81 MG chewable tablet Chew 1 tablet (81 mg total) by mouth 2 (two) times daily. 05/30/18   Swinteck, Arlys John, MD  CALCIUM CARBONATE-VITAMIN D PO Take 1 tablet by mouth daily with breakfast.     [provider]  Cholecalciferol (VITAMIN D) 50 MCG (2000 UT) CAPS Take 2,000 Units by mouth daily.     [provider]  hydrochlorothiazide (HYDRODIURIL) 25 MG tablet Take 25 mg by mouth every morning.    [provider]  TURMERIC PO Take 200 mg by mouth daily.    [provider]  vitamin B-12 (CYANOCOBALAMIN) 1000 MCG tablet Take 1,000 mcg by mouth daily.    [provider]    Family History History reviewed. No pertinent family history.  Social History Social History   Tobacco Use   Smoking status: Never   Smokeless tobacco: Never  Substance Use Topics   Alcohol use: Yes    Comment:  wine x2 glasses twice weekly   Drug use: No     Allergies   Prinivil [lisinopril]   Review of Systems Review of Systems  Constitutional:  Negative for chills and fever.  HENT:  Positive for congestion and sinus pressure. Negative for ear pain and sore throat.   Eyes:  Negative for discharge and redness.  Respiratory:  Negative for shortness of breath.   Cardiovascular:  Negative for chest pain.  Gastrointestinal:  Negative for nausea and vomiting.  Neurological:  Positive for dizziness.    Physical Exam Triage Vital Signs ED Triage Vitals [03/16/21 1013]  Enc Vitals Group     BP 118/71     Pulse Rate 86     Resp 18     Temp 97.8 F (36.6 C)     Temp Source Oral     SpO2 93 %     Weight       Height      Head Circumference      Peak Flow      Pain Score 0     Pain Loc      Pain Edu?      Excl. in GC?    No data found.  Updated Vital Signs BP 118/71 (BP Location: Right Arm)   Pulse 86   Temp 97.8 F (36.6 C) (Oral)   Resp 18   SpO2 93%      Physical Exam Vitals and nursing note reviewed.  Constitutional:      General: She is not in acute distress.    Appearance: Normal appearance. She is not ill-appearing.  HENT:     Head: Normocephalic and atraumatic.     Right Ear: Tympanic membrane normal.     Left Ear: Tympanic membrane normal.     Nose: Congestion present.     Mouth/Throat:     Mouth: Mucous membranes are moist.     Pharynx: Oropharynx is clear. No oropharyngeal exudate or posterior oropharyngeal erythema.  Eyes:     Conjunctiva/sclera: Conjunctivae normal.  Cardiovascular:     Rate and Rhythm: Normal rate and regular rhythm.     Heart sounds: Normal heart sounds. No murmur heard. Pulmonary:     Effort: Pulmonary effort is normal. No respiratory distress.     Breath sounds: Normal breath sounds. No wheezing, rhonchi or rales.  Neurological:     Mental Status: She is alert and oriented to person, place, and time.     Cranial Nerves: No facial asymmetry.     Coordination: Coordination normal. Finger-Nose-Finger Test normal.     Comments: Normal speech  Psychiatric:        Mood and Affect: Mood normal.        Behavior: Behavior normal.     UC Treatments / Results  Labs (all labs ordered are listed, but only abnormal results are displayed) Labs Reviewed - No data to display  EKG   Radiology No results found.  Procedures Procedures (including critical care time)  Medications Ordered in UC Medications - No data to display  Initial Impression / Assessment and Plan / UC Course  I have reviewed the triage vital signs and the nursing notes.  Pertinent labs & imaging results that were available during my care of the patient were  reviewed by me and considered in my medical decision making (see chart for details).  Meclizine prescribed for vertigo, and Augmentin prescribed for suspected sinus infection given prolonged symptoms.  Recommended follow-up if symptoms fail to improve  or worsen anyway.  Final Clinical Impressions(s) / UC Diagnoses   Final diagnoses:  Vertigo  Acute sinusitis, recurrence not specified, unspecified location   Discharge Instructions   None    ED Prescriptions     Medication Sig Dispense Auth. Provider   meclizine (ANTIVERT) 12.5 MG tablet Take 1 tablet (12.5 mg total) by mouth 3 (three) times daily as needed for dizziness. 30 tablet Erma Pinto F, PA-C   amoxicillin-clavulanate (AUGMENTIN) 875-125 MG tablet Take 1 tablet by mouth every 12 (twelve) hours. 14 tablet Tomi Bamberger, PA-C      PDMP not reviewed this encounter.   Tomi Bamberger, PA-C 03/16/21 1117

## 2021-03-23 ENCOUNTER — Telehealth: Payer: Self-pay | Admitting: Orthopaedic Surgery

## 2021-03-23 ENCOUNTER — Other Ambulatory Visit: Payer: Self-pay | Admitting: Physician Assistant

## 2021-03-23 MED ORDER — DIAZEPAM 5 MG PO TABS: 5.0000 mg | ORAL_TABLET | Freq: Once | ORAL | 0 refills | Status: AC

## 2021-03-23 NOTE — Telephone Encounter (Signed)
Pt would like to know if she can get something for anxiety for her MRI on Saturday. Would like a call when its called in!   CB [phone]  [facility] on [address]

## 2021-03-25 ENCOUNTER — Other Ambulatory Visit: Payer: Self-pay

## 2021-03-25 ENCOUNTER — Ambulatory Visit
Admission: RE | Admit: 2021-03-25 | Discharge: 2021-03-25 | Disposition: A | Discharge status: Home / Self Care | Payer: Medicare Other | Source: Ambulatory Visit | Attending: Orthopaedic Surgery | Admitting: Orthopaedic Surgery

## 2021-03-25 DIAGNOSIS — M545 Low back pain, unspecified: Secondary | ICD-10-CM

## 2021-03-25 DIAGNOSIS — G8929 Other chronic pain: Secondary | ICD-10-CM

## 2021-03-27 NOTE — Telephone Encounter (Signed)
Err

## 2021-03-29 ENCOUNTER — Encounter: Payer: Self-pay | Admitting: Orthopaedic Surgery

## 2021-03-29 ENCOUNTER — Ambulatory Visit (INDEPENDENT_AMBULATORY_CARE_PROVIDER_SITE_OTHER): Payer: Medicare Other | Admitting: Orthopaedic Surgery

## 2021-03-29 ENCOUNTER — Other Ambulatory Visit: Payer: Self-pay

## 2021-03-29 DIAGNOSIS — M5440 Lumbago with sciatica, unspecified side: Secondary | ICD-10-CM

## 2021-03-29 NOTE — Progress Notes (Signed)
Office Visit Note   Patient: Kayla Bridges           Date of Birth: 1950-06-07           MRN: [id number] Visit Date: 03/29/2021              Requested by: Dois Davenport, MD [address] [place],  [place] [address] PCP: Dois Davenport, MD   Assessment & Plan: Visit Diagnoses: No diagnosis found.  Plan: Pleasant 70 year old woman who comes in for follow-up of her MRI results of her lumbar spine.  She is status post hip replacement on that side in 2020 her hip x-rays actually have looked quite good.  MRI to review today to define whether more of this may be hip or back related.  She has started physical therapy but unfortunately had to miss several sessions because she had a sinus infection we reviewed the MRI which did not show any central canal stenosis.  She does have some foraminal stenosis.  At the T12-L1 layer level there is a broad-based disc protrusion and moderate right foraminal narrowing this could coordinate with some of her pain that goes down into her hip.  She also has continued to give a history of having to stop while she is walking to sit down.  She denies that this is specifically to her right leg giving out she just feels tired and cannot the line.  She also talks about balance issues.  She will continue with physical therapy for now.  We have also recommended that she schedule appointment with her primary care provider to rule out any other medical causes for her fatigue and balance.  If she did not get good improvement with physical therapy with regards to her lower back issues we could refer her for epidural steroid injections  Follow-Up Instructions: No follow-ups on file.   Orders:  No orders of the defined types were placed in this encounter.  No orders of the defined types were placed in this encounter.     Procedures: No procedures performed   Clinical Data: No additional findings.   Subjective: Chief Complaint  Patient presents with    Lower Back - Follow-up    MRI review  Patient presents today for follow up on her lower back. She had an MRI and is here to discuss those results.    Review of Systems  All other systems reviewed and are negative.   Objective: Vital Signs: There were no vitals taken for this visit.  Physical Exam Patient is doing well comfortable conversation and exam  Ortho Exam Exam unchanged from previous prior to the MRI.  She has right lower extremity radiation from her lower back.  She has good rotation of her hip no tenderness over the trochanteric bursa Specialty Comments:  No specialty comments available.  Imaging: No results found.   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  Past Surgical History:  Procedure Laterality Date   ABDOMINAL HYSTERECTOMY     BREAST EXCISIONAL BIOPSY Left  BREAST SURGERY     lumpectomy-benign   CERVICAL BIOPSY  W/ LOOP ELECTRODE EXCISION     TOTAL HIP ARTHROPLASTY Left 08/17/2014   Procedure: LEFT TOTAL HIP ARTHROPLASTY ANTERIOR APPROACH;  Surgeon: Durene Romans, MD;  Location: [facility];  Service: Orthopedics;  Laterality: Left;   TOTAL HIP ARTHROPLASTY Right 05/29/2018   Procedure: TOTAL HIP ARTHROPLASTY ANTERIOR APPROACH;  Surgeon: Samson Frederic, MD;  Location: [facility];  Service: Orthopedics;  Laterality: Right;   TUBAL LIGATION     Social History   Occupational History   Not on file  Tobacco Use   Smoking status: Never   Smokeless tobacco: Never  Substance and Sexual Activity   Alcohol use: Yes    Comment: wine x2 glasses twice weekly   Drug use: No   Sexual activity: Not  Currently

## 2021-05-04 ENCOUNTER — Other Ambulatory Visit: Payer: Self-pay

## 2021-05-04 ENCOUNTER — Telehealth: Payer: Self-pay

## 2021-05-04 DIAGNOSIS — M5440 Lumbago with sciatica, unspecified side: Secondary | ICD-10-CM

## 2021-05-04 DIAGNOSIS — G8929 Other chronic pain: Secondary | ICD-10-CM

## 2021-05-04 NOTE — Telephone Encounter (Signed)
Referral in

## 2021-05-04 NOTE — Telephone Encounter (Signed)
Patient has sent in request for a cortisone injection in her back. Are you okay with a referral to Dr.Newton for an ESI?

## 2021-05-04 NOTE — Telephone Encounter (Signed)
Ok to refer to Dr Alvester Morin for [employer]

## 2021-05-30 ENCOUNTER — Ambulatory Visit: Payer: Medicare Other | Admitting: Physical Medicine and Rehabilitation

## 2021-06-05 ENCOUNTER — Ambulatory Visit
Admission: RE | Admit: 2021-06-05 | Discharge: 2021-06-05 | Disposition: A | Discharge status: Home / Self Care | Payer: Medicare Other | Source: Ambulatory Visit | Attending: Internal Medicine | Admitting: Internal Medicine

## 2021-06-05 ENCOUNTER — Other Ambulatory Visit: Payer: Self-pay

## 2021-06-05 VITALS — BP 134/85 | HR 94 | Temp 98.0°F | Resp 16

## 2021-06-05 DIAGNOSIS — J069 Acute upper respiratory infection, unspecified: Secondary | ICD-10-CM | POA: Insufficient documentation

## 2021-06-05 LAB — POCT RAPID STREP A (OFFICE): Rapid Strep A Screen: NEGATIVE

## 2021-06-05 MED ORDER — FLUTICASONE PROPIONATE 50 MCG/ACT NA SUSP: 1.0000 | Freq: Every day | NASAL | 0 refills | Status: AC

## 2021-06-05 MED ORDER — BENZONATATE 100 MG PO CAPS: 100.0000 mg | ORAL_CAPSULE | Freq: Three times a day (TID) | ORAL | 0 refills | Status: AC | PRN

## 2021-06-05 NOTE — ED Triage Notes (Addendum)
Friday night began having cough, sneezing, sore throat, body aches, fever. Improving into today. Believes she may have covid. Denies headaches, N/V/D. Reports feeling mildly SOB. O2 sat 91-93%, frequently taking deep breaths

## 2021-06-05 NOTE — ED Provider Notes (Signed)
[name]    CSN: [id number] Arrival date & time: 06/05/21  1152      History   Chief Complaint Chief Complaint  Patient presents with   12 Appt   Cough   Sore Throat    HPI Kayla Bridges is a 71 y.o. female.   Patient presents with 4-day history of nonproductive cough, sneezing, sore throat, body aches, fever, fatigue.  Patient not sure of T-max at home but states that she "felt feverish".  Denies any known sick contacts.  Denies chest pain, shortness of breath, ear pain, nausea, vomiting, diarrhea, abdominal pain.  Patient has taken over-the-counter cold and flu medications with some improvement in symptoms.  Patient denies history of COPD, asthma,and patient denies smoking.   Cough Sore Throat   Past Medical History:  Diagnosis Date   Arthritis    osteoarthritis left hip   Elevated cholesterol    patient denies    GERD (gastroesophageal reflux disease)    HPV in female    Hypertension     Patient Active Problem List   Diagnosis Date Noted   Osteoarthritis of right hip 05/29/2018   Overweight (BMI 25.0-29.9) 08/18/2014   S/P left THA, AA 08/17/2014    Past Surgical History:  Procedure Laterality Date   ABDOMINAL HYSTERECTOMY     BREAST EXCISIONAL BIOPSY Left    BREAST SURGERY     lumpectomy-benign   CERVICAL BIOPSY  W/ LOOP ELECTRODE EXCISION     TOTAL HIP ARTHROPLASTY Left 08/17/2014   Procedure: LEFT TOTAL HIP ARTHROPLASTY ANTERIOR APPROACH;  Surgeon: Durene RomansMatthew Olin, MD;  Location: [facility];  Service: Orthopedics;  Laterality: Left;   TOTAL HIP ARTHROPLASTY Right 05/29/2018   Procedure: TOTAL HIP ARTHROPLASTY ANTERIOR APPROACH;  Surgeon: Samson FredericSwinteck, Brian, MD;  Location: [facility];  Service: Orthopedics;  Laterality: Right;   TUBAL LIGATION      OB History   No obstetric history on file.      Home Medications    Prior to Admission medications   Medication Sig Start Date End Date Taking? Authorizing Provider  benzonatate (TESSALON) 100  MG capsule Take 1 capsule (100 mg total) by mouth every 8 (eight) hours as needed for cough. 06/05/21  Yes Wilmot Quevedo, Acie FredricksonHaley E, FNP  fluticasone (FLONASE) 50 MCG/ACT nasal spray Place 1 spray into both nostrils daily for 3 days. 06/05/21 06/08/21 Yes Gustavus BryantMound, Dinh Ayotte E, FNP  aspirin 81 MG chewable tablet Chew 1 tablet (81 mg total) by mouth 2 (two) times daily. 05/30/18   Swinteck, Arlys JohnBrian, MD  CALCIUM CARBONATE-VITAMIN D PO Take 1 tablet by mouth daily with breakfast.     [provider]  Cholecalciferol (VITAMIN D) 50 MCG (2000 UT) CAPS Take 2,000 Units by mouth daily.     [provider]  hydrochlorothiazide (HYDRODIURIL) 25 MG tablet Take 25 mg by mouth every morning.    [provider]  meclizine (ANTIVERT) 12.5 MG tablet Take 1 tablet (12.5 mg total) by mouth 3 (three) times daily as needed for dizziness. 03/16/21   Tomi BambergerMyers, Rebecca F, PA-C  TURMERIC PO Take 200 mg by mouth daily.    [provider]  vitamin B-12 (CYANOCOBALAMIN) 1000 MCG tablet Take 1,000 mcg by mouth daily.    [provider]    Family History History reviewed. No pertinent family history.  Social History Social History   Tobacco Use   Smoking status: Never   Smokeless tobacco: Never  Substance Use Topics   Alcohol use: Yes  Comment: wine x2 glasses twice weekly   Drug use: No     Allergies   Prinivil [lisinopril]   Review of Systems Review of Systems Per HPI  Physical Exam Triage Vital Signs ED Triage Vitals  Enc Vitals Group     BP 06/05/21 1213 134/85     Pulse Rate 06/05/21 1213 94     Resp 06/05/21 1213 16     Temp 06/05/21 1213 98 F (36.7 C)     Temp Source 06/05/21 1213 Oral     SpO2 06/05/21 1213 93 %     Weight --      Height --      Head Circumference --      Peak Flow --      Pain Score 06/05/21 1212 0     Pain Loc --      Pain Edu? --      Excl. in GC? --    No data found.  Updated Vital Signs BP 134/85 (BP Location: Left Arm)    Pulse 94     Temp 98 F (36.7 C) (Oral)    Resp 16    SpO2 94%   Visual Acuity Right Eye Distance:   Left Eye Distance:   Bilateral Distance:    Right Eye Near:   Left Eye Near:    Bilateral Near:     Physical Exam Constitutional:      General: She is not in acute distress.    Appearance: Normal appearance. She is not toxic-appearing or diaphoretic.  HENT:     Head: Normocephalic and atraumatic.     Right Ear: Tympanic membrane and ear canal normal.     Left Ear: Tympanic membrane and ear canal normal.     Nose: Congestion present.     Mouth/Throat:     Mouth: Mucous membranes are moist.     Pharynx: Posterior oropharyngeal erythema present.  Eyes:     Extraocular Movements: Extraocular movements intact.     Conjunctiva/sclera: Conjunctivae normal.     Pupils: Pupils are equal, round, and reactive to light.  Cardiovascular:     Rate and Rhythm: Normal rate and regular rhythm.     Pulses: Normal pulses.     Heart sounds: Normal heart sounds.  Pulmonary:     Effort: Pulmonary effort is normal. No respiratory distress.     Breath sounds: Normal breath sounds. No stridor. No wheezing, rhonchi or rales.  Abdominal:     General: Abdomen is flat. Bowel sounds are normal.     Palpations: Abdomen is soft.  Musculoskeletal:        General: Normal range of motion.     Cervical back: Normal range of motion.  Skin:    General: Skin is warm and dry.  Neurological:     General: No focal deficit present.     Mental Status: She is alert and oriented to person, place, and time. Mental status is at baseline.  Psychiatric:        Mood and Affect: Mood normal.        Behavior: Behavior normal.     UC Treatments / Results  Labs (all labs ordered are listed, but only abnormal results are displayed) Labs Reviewed  COVID-19, FLU A+B NAA  CULTURE, GROUP A STREP [facility])  POCT RAPID STREP A (OFFICE)    EKG   Radiology No results found.  Procedures Procedures (including critical care  time)  Medications Ordered in UC Medications - No data to  display  Initial Impression / Assessment and Plan / UC Course  I have reviewed the triage vital signs and the nursing notes.  Pertinent labs & imaging results that were available during my care of the patient were reviewed by me and considered in my medical decision making (see chart for details).     Patient presents with symptoms likely from a viral upper respiratory infection. Differential includes bacterial pneumonia, sinusitis, allergic rhinitis, COVID-19, flu. Do not suspect underlying cardiopulmonary process. Symptoms seem unlikely related to ACS, CHF or COPD exacerbations, pneumonia, pneumothorax. Patient is nontoxic appearing and not in need of emergent medical intervention.  Rapid strep test completed prior to provider examination per nursing protocol that was negative.  Throat culture, COVID-19, flu test pending.  Recommended symptom control with over the counter medications.  Patient sent prescriptions.  Do not think that chest imaging is necessary given no adventitious lung sounds on exam or signs of respiratory compromise.  Patient's oxygen is ranging from 93 to 94% but this seems consistent with previous readings at different office visits.  There is no signs of tachypnea or respiratory compromise, so do not think that this warrants further evaluation and management at the hospital at this time.  Discussed strict ER precautions.  Return if symptoms fail to improve in 1-2 weeks or you develop shortness of breath, chest pain, severe headache. Patient states understanding and is agreeable.  Discharged with PCP followup.  Final Clinical Impressions(s) / UC Diagnoses   Final diagnoses:  Viral upper respiratory tract infection with cough     Discharge Instructions      It appears that you have a viral upper respiratory infection that should resolve on its own in the next few days.  Please go to the hospital if symptoms  worsen or progress.  COVID-19 and flu test is pending.  We will call if it is positive.    ED Prescriptions     Medication Sig Dispense Auth. Provider   benzonatate (TESSALON) 100 MG capsule Take 1 capsule (100 mg total) by mouth every 8 (eight) hours as needed for cough. 21 capsule [place], [place] E, [place]   fluticasone [facility]) 50 MCG/ACT nasal spray Place 1 spray into both nostrils daily for 3 days. 16 g Gustavus Bryant, [place]      PDMP not reviewed this encounter.   Gustavus Bryant, [place] 06/05/21 1308

## 2021-06-05 NOTE — Discharge Instructions (Addendum)
It appears that you have a viral upper respiratory infection that should resolve on its own in the next few days.  Please go to the hospital if symptoms worsen or progress.  COVID-19 and flu test is pending.  We will call if it is positive.

## 2021-06-06 LAB — COVID-19, FLU A+B NAA
Influenza A, NAA: NOT DETECTED
Influenza B, NAA: NOT DETECTED
SARS-CoV-2, NAA: NOT DETECTED

## 2021-06-07 LAB — CULTURE, GROUP A STREP (THRC)

## 2021-09-20 ENCOUNTER — Ambulatory Visit: Payer: Medicare Other | Admitting: Orthopaedic Surgery

## 2022-01-23 ENCOUNTER — Other Ambulatory Visit: Payer: Self-pay | Admitting: Family Medicine

## 2022-01-23 DIAGNOSIS — M8580 Other specified disorders of bone density and structure, unspecified site: Secondary | ICD-10-CM

## 2022-07-11 ENCOUNTER — Ambulatory Visit
Admission: RE | Admit: 2022-07-11 | Discharge: 2022-07-11 | Disposition: A | Discharge status: Home / Self Care | Payer: Medicare Other | Source: Ambulatory Visit | Attending: Family Medicine | Admitting: Family Medicine

## 2022-07-11 ENCOUNTER — Other Ambulatory Visit: Payer: Self-pay | Admitting: Family Medicine

## 2022-07-11 DIAGNOSIS — R0789 Other chest pain: Secondary | ICD-10-CM

## 2022-07-11 DIAGNOSIS — M546 Pain in thoracic spine: Secondary | ICD-10-CM

## 2022-07-12 ENCOUNTER — Ambulatory Visit
Admission: RE | Admit: 2022-07-12 | Discharge: 2022-07-12 | Disposition: A | Discharge status: Home / Self Care | Payer: Medicare Other | Source: Ambulatory Visit | Attending: Family Medicine | Admitting: Family Medicine

## 2022-07-12 DIAGNOSIS — M8580 Other specified disorders of bone density and structure, unspecified site: Secondary | ICD-10-CM

## 2022-07-18 ENCOUNTER — Other Ambulatory Visit: Payer: Self-pay | Admitting: Family Medicine

## 2022-07-18 DIAGNOSIS — R112 Nausea with vomiting, unspecified: Secondary | ICD-10-CM

## 2022-08-15 ENCOUNTER — Ambulatory Visit
Admission: RE | Admit: 2022-08-15 | Discharge: 2022-08-15 | Disposition: A | Discharge status: Home / Self Care | Payer: Medicare Other | Source: Ambulatory Visit | Attending: Family Medicine | Admitting: Family Medicine

## 2022-08-15 DIAGNOSIS — R112 Nausea with vomiting, unspecified: Secondary | ICD-10-CM

## 2022-08-16 ENCOUNTER — Ambulatory Visit: Payer: Medicare Other | Admitting: Family

## 2022-09-07 ENCOUNTER — Ambulatory Visit: Payer: Medicare Other | Admitting: Orthopedic Surgery

## 2022-09-12 ENCOUNTER — Encounter: Payer: Self-pay | Admitting: Orthopedic Surgery

## 2022-09-12 ENCOUNTER — Other Ambulatory Visit (INDEPENDENT_AMBULATORY_CARE_PROVIDER_SITE_OTHER): Payer: Medicare Other

## 2022-09-12 ENCOUNTER — Ambulatory Visit (INDEPENDENT_AMBULATORY_CARE_PROVIDER_SITE_OTHER): Payer: Medicare Other | Admitting: Orthopedic Surgery

## 2022-09-12 VITALS — BP 126/71 | HR 83 | Ht 65.0 in | Wt 149.0 lb

## 2022-09-12 DIAGNOSIS — G8929 Other chronic pain: Secondary | ICD-10-CM | POA: Diagnosis not present

## 2022-09-12 DIAGNOSIS — M5440 Lumbago with sciatica, unspecified side: Secondary | ICD-10-CM | POA: Diagnosis not present

## 2022-09-12 DIAGNOSIS — M545 Low back pain, unspecified: Secondary | ICD-10-CM

## 2022-09-12 NOTE — Progress Notes (Signed)
Orthopedic Spine Surgery Office Note  Assessment: Patient is a 72 y.o. female with periodic low back pain with popping sensation. No radicular symptoms. Likely facet arthropathy   Plan: -Explained that her periodic popping in the back is likely related to facet arthropathy -Patient has tried PT, Tylenol, acupuncture  -Recommended continued Tylenol use as needed -Patient should return to office on an as-needed basis   Patient expressed understanding of the plan and all questions were answered to the patient's satisfaction.   ___________________________________________________________________________   History:  Patient is a 72 y.o. female who presents today for lumbar spine. Patient has had several years of periodic low back pain that has a popping sensation. It resolves spontaneously. There was no trauma or injury that preceded the onset of these symptoms. No pain radiating into either lower extremity. Is not taking any medication regularly for the popping or pain in her low back. Just wanted to make sure nothing serious was going on.    Weakness: denies Symptoms of imbalance: denies Paresthesias and numbness: denies Bowel or bladder incontinence: denies  Saddle anesthesia: denies  Treatments tried: Physical therapy, Tylenol, acupuncture  Review of systems: Denies fevers and chills, night sweats, unexplained weight loss, history of cancer, pain that wakes them at night  Past medical history: HLD GERD HTN Osteoporosis RA  Allergies: lisinopril   Past surgical history:  Hysterectomy Breast excisional biopsy Bilateral THA Tubal ligation  Social history: Denies use of nicotine product (smoking, vaping, patches, smokeless) Alcohol use: yes, 1 per week Denies recreational drug use   Physical Exam:  BMI of 24.8  General: no acute distress, appears stated age Neurologic: alert, answering questions appropriately, following commands Respiratory: unlabored breathing  on room air, symmetric chest rise Psychiatric: appropriate affect, normal cadence to speech   MSK (spine):  -Strength exam      Left  Right EHL    5/5  5/5 TA    5/5  5/5 GSC    5/5  5/5 Knee extension  5/5  5/5 Hip flexion   5/5  5/5  -Sensory exam    Sensation intact to light touch in L3-S1 nerve distributions of bilateral lower extremities  -Achilles DTR: 2/4 on the left, 2/4 on the right -Patellar tendon DTR: 2/4 on the left, 2/4 on the right  -Straight leg raise: negative bilaterally -Femoral nerve stretch test: negative bilaterally -Clonus: no beats bilaterally  -Left hip exam: no pain through range of motion, negative faber, negative stinchfield -Right hip exam: no pain through range of motion, negative faber, negative stinchfield  Imaging: XR of the lumbar spine from 09/12/2022 was independently reviewed and interpreted, showing grade 1 anterolisthesis at L4/5 that shifts less than 1 mm between flexion-extension views.  No fracture or dislocation seen.  Facet arthropathy in the lower lumbar spine. No other significant degenerative changes.    Patient name: Kayla Bridges Patient MRN: [id number] Date of visit: 09/12/22

## 2023-05-22 IMAGING — MR MR LUMBAR SPINE W/O CM
4 of 5 series · 18 of 48 positions shown · non-contrast
Comparison: None.

CLINICAL DATA: Back pain greater than 6 weeks. Right leg pain
following hip replacement 5151.

EXAM:
MRI LUMBAR SPINE WITHOUT CONTRAST
TECHNIQUE: Multiplanar, multisequence MR imaging of the lumbar spine was
performed. No intravenous contrast was administered.

[Series 6: T2 · sagittal · 4.0mm · 0.73mm/px · 6 of 15 slices shown (1 of 2)]
[im 1/15]
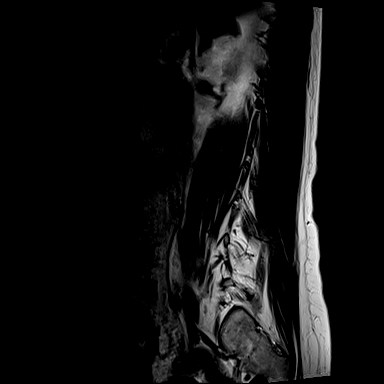
[im 3/15]
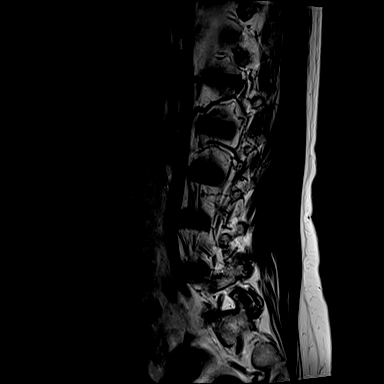
[im 6/15]
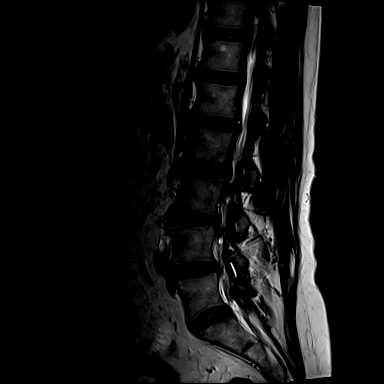
[im 9/15]
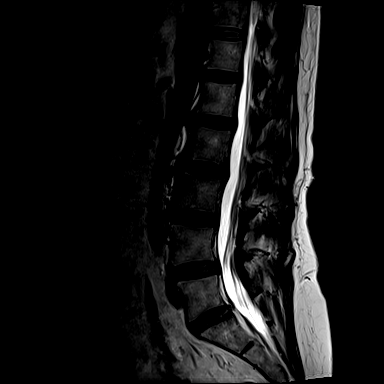
[im 12/15]
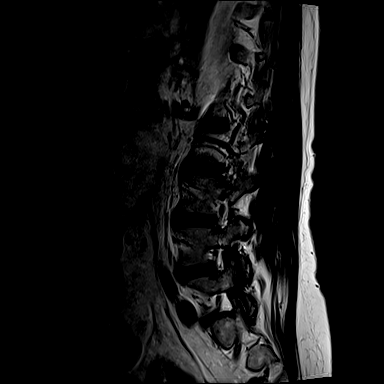
[im 15/15]
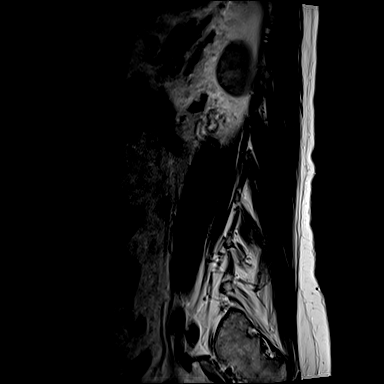

[Series 7: T1 · sagittal · 4.0mm · 0.73mm/px · 3 of 15 slices shown (1 of 2)]
[im 3/15]
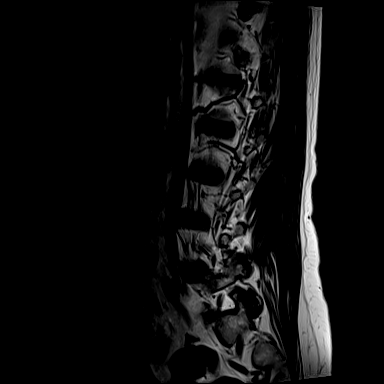
[im 9/15]
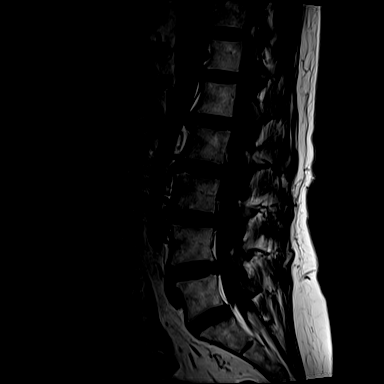
[im 15/15]
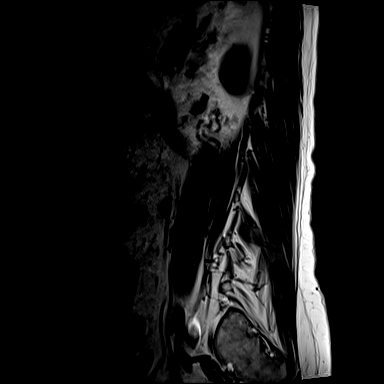

[Series 13: T2 · axial · 4.0mm · 0.28mm/px · z∈[-79,+105]mm · 6 of 41 slices shown (2 of 2)]
[im 1/41]
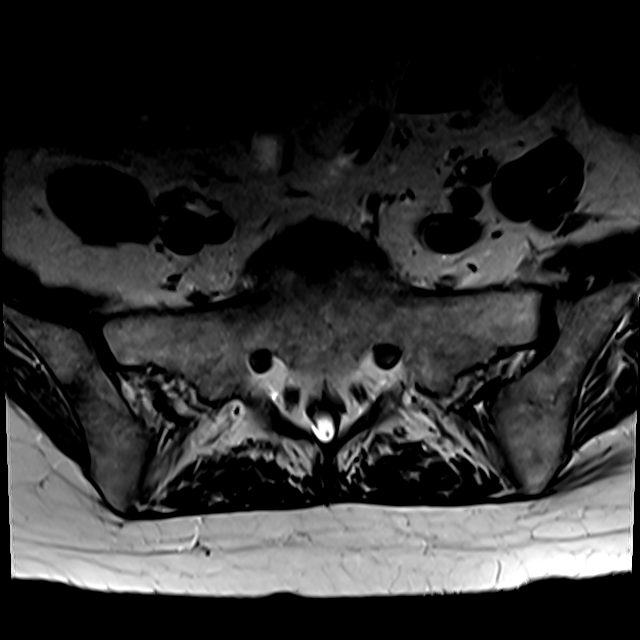
[im 6/41]
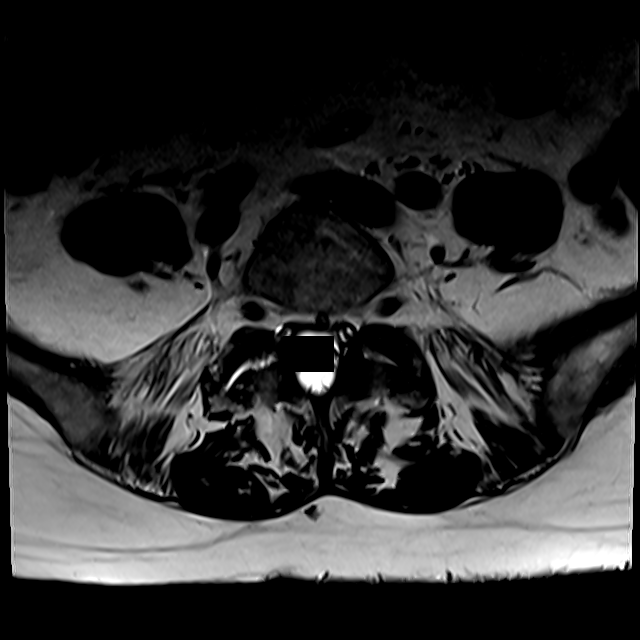
[im 12/41]
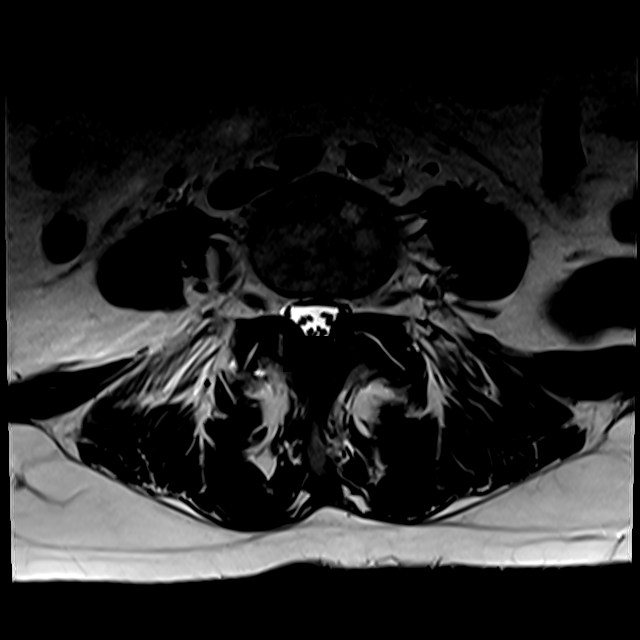
[im 18/41]
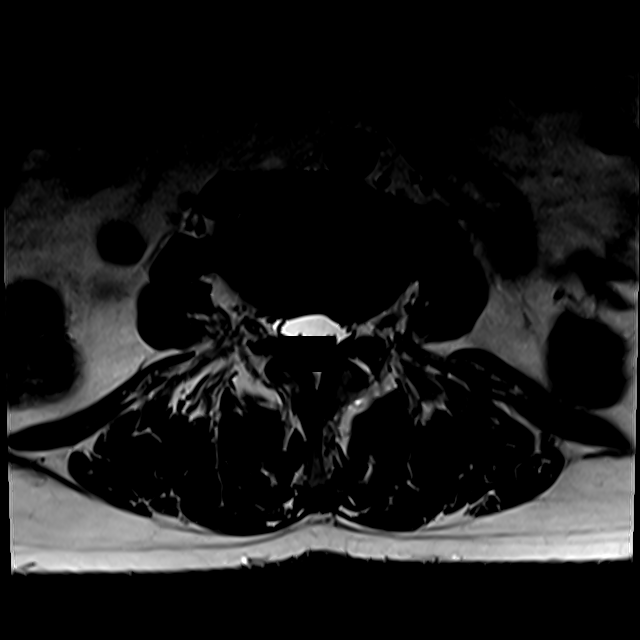
[im 21/41]
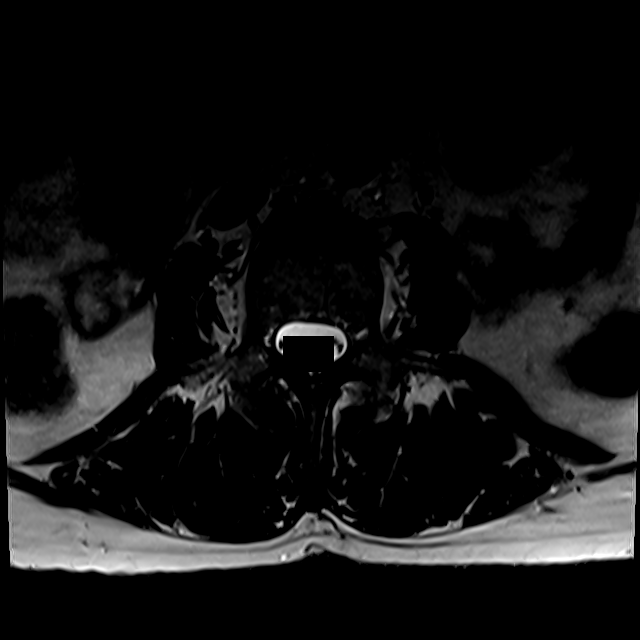
[im 35/41]
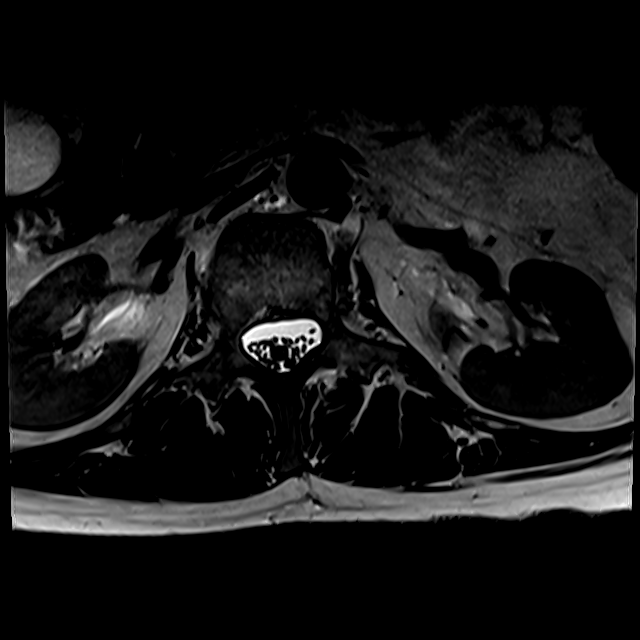

[Series 100: T1 · axial · 4.0mm · 0.28mm/px · z∈[-54,+105]mm · 3 of 41 slices shown (2 of 2)]
[im 6/41]
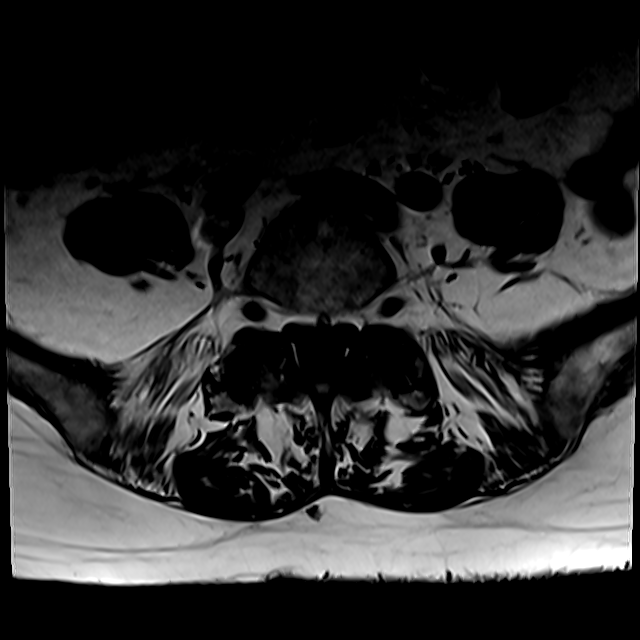
[im 21/41]
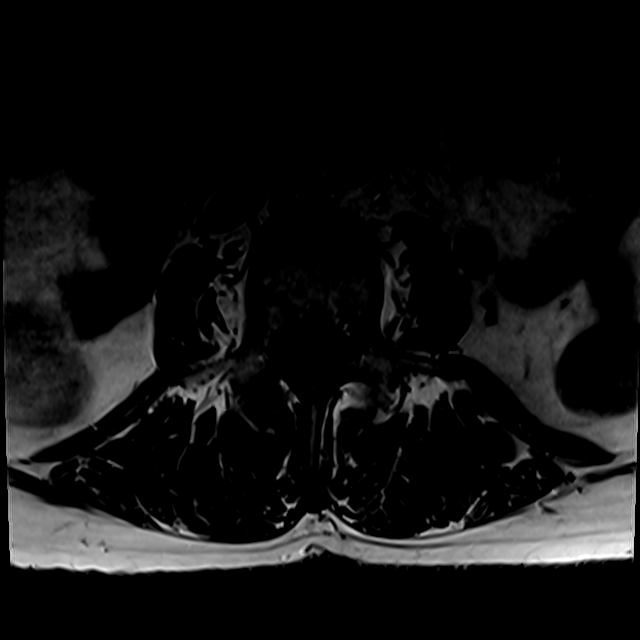
[im 35/41]
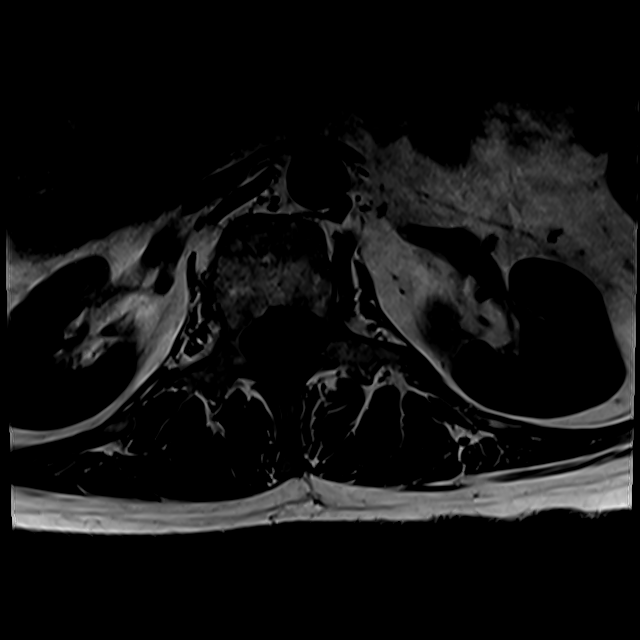

[18 of 48 positions shown; findings below may reference images not displayed]

FINDINGS: Segmentation: 5 non rib-bearing lumbar type vertebral bodies are
present. The lowest fully formed vertebral body is L5.

Alignment: Slight degenerative anterolisthesis is present at L4-5.
No other significant listhesis is present. Mild straightening of
normal lumbar lordosis is present. Levoconvex curvature is centered
at L4.

Vertebrae:  Marrow signal and vertebral body heights are normal.

Conus medullaris and cauda equina: Conus extends to the L1-2 level.
Conus and cauda equina appear normal.

Paraspinal and other soft tissues: Limited imaging the abdomen is
unremarkable. There is no significant adenopathy. No solid organ
lesions are present.

Disc levels:

T12-L1: A broad-based disc protrusion is present. Central annular
tear is noted. Asymmetric right-sided facet hypertrophy contributes
to moderate right foraminal narrowing. The central canal is patent.

L1-2: A broad-based disc protrusion is present. Mild facet
hypertrophy is noted bilaterally. A right paramedian annular tear is
present. Mild right foraminal narrowing is present.

L2-3: A broad-based disc protrusion is present. A far left annular
tear is noted. Mild foraminal narrowing is present bilaterally.

L3-4: A leftward disc protrusion is present. Focal annular tear is
present. This potentially contacts the traversing left L4 nerve
root. The foramina are patent.

L4-5: Mild uncovering of a broad-based disc protrusion is present.
Facet hypertrophy is worse on left. This results in moderate left
subarticular narrowing. Mild left foraminal narrowing is present.

L5-S1: Mild facet hypertrophy is present bilaterally. No focal disc
protrusion or stenosis is present.
IMPRESSION: 1. Multilevel spondylosis of the lumbar spine as described.
2. Moderate right foraminal narrowing at T12-L1 secondary to a
broad-based disc protrusion and bilateral facet hypertrophy. This is
the most significant right-sided disease.
3. Mild right foraminal narrowing at L1-2.
4. Mild foraminal narrowing bilaterally at L2-3.
5. Moderate left subarticular and mild left foraminal narrowing at
L4-5.
6. Mild facet hypertrophy bilaterally at L5-S1 without significant
stenosis.

## 2024-02-02 ENCOUNTER — Ambulatory Visit: Admission: EM | Admit: 2024-02-02 | Discharge: 2024-02-02 | Disposition: A | Discharge status: Home / Self Care

## 2024-02-02 ENCOUNTER — Other Ambulatory Visit: Payer: Self-pay

## 2024-02-02 ENCOUNTER — Encounter: Payer: Self-pay | Admitting: *Deleted

## 2024-02-02 DIAGNOSIS — L03116 Cellulitis of left lower limb: Secondary | ICD-10-CM

## 2024-02-02 DIAGNOSIS — S81802A Unspecified open wound, left lower leg, initial encounter: Secondary | ICD-10-CM

## 2024-02-02 MED ORDER — DOXYCYCLINE HYCLATE 100 MG PO CAPS: 100.0000 mg | ORAL_CAPSULE | Freq: Two times a day (BID) | ORAL | 0 refills | Status: AC

## 2024-02-02 NOTE — Discharge Instructions (Addendum)
 Doxycycline  antibiotic. Twice daily for 7 days in a row.  Please finish all of medication, you should not have any leftover pills. It can take about 3 days to start working for infection. Keep the area clean with soap and water .  Please do not use any peroxide or alcohol  as this can interfere with wound healing.  Return with any new symptoms or concerns

## 2024-02-02 NOTE — ED Triage Notes (Addendum)
 Presents with wound to left lower leg above inner ankle. States she was working in the yard approx 2 weeks ago and got a piece of wood in her leg. She removed it and cleaned the wound with alcohol  and peroxide and has been applying antibiotic ointment. Area around wound is red and swollen. States she has had a tetanus shot recently but can't recall when.

## 2024-02-02 NOTE — ED Provider Notes (Signed)
 [name]    CSN: [id number] Arrival date & time: 02/02/24  0914      History   Chief Complaint Chief Complaint  Patient presents with   Wound Check    HPI Kayla Bridges is a 73 y.o. female.  About 2 weeks ago doing yard work a Photographer of wood cut her left inner ankle.  It has been red, swollen, sore.  She has been continuously cleaning with alcohol  and peroxide, applying topical antibiotic ointment.  The area has not improved, and has not worsened.  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    Past Surgical History:  Procedure Laterality Date   ABDOMINAL HYSTERECTOMY     BREAST EXCISIONAL BIOPSY Left    BREAST SURGERY     lumpectomy-benign   CERVICAL BIOPSY  W/ LOOP ELECTRODE EXCISION     TOTAL HIP ARTHROPLASTY Left 08/17/2014   Procedure: LEFT TOTAL HIP ARTHROPLASTY ANTERIOR APPROACH;  Surgeon: Donnice Car, MD;  Location: [facility];  Service: Orthopedics;  Laterality: Left;   TOTAL HIP ARTHROPLASTY Right 05/29/2018   Procedure: TOTAL HIP ARTHROPLASTY ANTERIOR APPROACH;  Surgeon: Fidel Rogue, MD;  Location: [facility];  Service: Orthopedics;  Laterality: Right;   TUBAL LIGATION      OB History   No obstetric history on file.      Home Medications    Prior to Admission medications   Medication Sig Start Date End Date Taking? Authorizing Provider  aspirin  81 MG chewable tablet Chew 1 tablet (81 mg total) by mouth 2 (two) times daily. 05/30/18  Yes Swinteck, Rogue, MD  Azelastine HCl 137 MCG/SPRAY SOLN Place 1 spray into the nose 2  (two) times daily. 08/27/18  Yes [provider]  CALCIUM  CARBONATE-VITAMIN D  PO Take 1 tablet by mouth daily with breakfast.    Yes [provider]  Cholecalciferol  (VITAMIN D ) 50 MCG (2000 UT) CAPS Take 2,000 Units by mouth daily.    Yes [provider]  diclofenac (VOLTAREN) 75 MG EC tablet Take 75 mg by mouth 2 (two) times daily with a meal. 04/16/23  Yes [provider]  doxycycline  (VIBRAMYCIN ) 100 MG capsule Take 1 capsule (100 mg total) by mouth 2 (two) times daily for 7 days. 02/02/24 02/09/24 Yes Delaney Schnick, [place], PA-C  ezetimibe (ZETIA) 10 MG tablet Take 10 mg by mouth daily.   Yes [provider]  famotidine (PEPCID) 40 MG tablet Take 40 mg by mouth every evening.   Yes [provider]  fluticasone  (FLONASE ) 50 MCG/ACT nasal spray Place 1 spray into both nostrils daily for 3 days. 06/05/21 02/02/24 Yes Mound, Darryle BRAVO, FNP  hydrochlorothiazide  (HYDRODIURIL ) 25 MG tablet Take 25 mg by mouth every morning.   Yes [provider]  irbesartan  (AVAPRO ) 300 MG tablet Take 300 mg by mouth daily. 03/26/16  Yes [provider]  meclizine  (ANTIVERT ) 12.5 MG tablet Take 1 tablet (12.5 mg total) by mouth 3 (three) times daily as needed for dizziness. 03/16/21  Yes Billy Asberry FALCON, PA-C  TURMERIC PO Take 200 mg by mouth daily.   Yes [provider]  vitamin B-12 (CYANOCOBALAMIN ) 1000 MCG tablet Take 1,000 mcg by mouth daily.   Yes [provider]  benzonatate  (TESSALON ) 100 MG capsule Take 1 capsule (100 mg total) by mouth every 8 (eight) hours as needed for cough. 06/05/21   Hazen Darryle BRAVO, FNP    Family History History reviewed. No pertinent family history.  Social History Social History   Tobacco Use   Smoking status: Never   Smokeless tobacco: Never  Vaping Use   Vaping status: Never Used  Substance Use Topics   Alcohol  use: Yes    Comment: wine x2 glasses twice weekly   Drug use: No     Allergies    Prinivil [lisinopril]   Review of Systems Review of Systems As per HPI  Physical Exam Triage Vital Signs ED Triage Vitals  Encounter Vitals Group     BP 02/02/24 0946 133/81     Girls Systolic BP Percentile --      Girls Diastolic BP Percentile --      Boys Systolic BP Percentile --      Boys Diastolic BP Percentile --      Pulse Rate 02/02/24 0946 82     Resp 02/02/24 0946 18     Temp 02/02/24 0946 97.8 F (36.6 C)     Temp Source 02/02/24 0946 Oral     SpO2 02/02/24 0946 97 %     Weight --      Height --      Head Circumference --      Peak Flow --      Pain Score 02/02/24 0939 3     Pain Loc --      Pain Education --      Exclude from Growth Chart --    No data found.  Updated Vital Signs BP 133/81 (BP Location: Left Arm)   Pulse 82   Temp 97.8 F (36.6 C) (Oral)   Resp 18   SpO2 97%    Physical Exam Vitals and nursing note reviewed.  Constitutional:      General: She is not in acute distress.    Appearance: Normal appearance.  HENT:     Mouth/Throat:     Pharynx: Oropharynx is clear.  Cardiovascular:     Rate and Rhythm: Normal rate and regular rhythm.     Heart sounds: Normal heart sounds.  Pulmonary:     Effort: Pulmonary effort is normal.     Breath sounds: Normal breath sounds.  Musculoskeletal:       Legs:     Comments: Left inner lower leg has eschar centrally with surrounding erythema about 2 inches diameter. Mildly swollen and somewhat tender. Not fluctuant or indurated. Distal sensation intact. Good ROM ankle. DP pulse 2+  Skin:    Findings: Erythema and wound present.  Neurological:     Mental Status: She is alert and oriented to person, place, and time.     UC Treatments / Results  Labs (all labs ordered are listed, but only abnormal results are displayed) Labs Reviewed - No data to display  EKG   Radiology No results found.  Procedures Procedures (including critical care time)  Medications Ordered in UC Medications -  No data to display  Initial Impression / Assessment and Plan / UC Course  I have reviewed the triage vital signs and the nursing notes.  Pertinent labs &  imaging results that were available during my care of the patient were reviewed by me and considered in my medical decision making (see chart for details).  Afebrile, well appearing  Wound on left lower leg with surrounding cellulitis Doxycycline  BID x 7 days Advised no further use of alcohol  or peroxide as this can interfere with wound healing.  Instead using plain soap and water .  Monitoring area for improvement, returning if needed.  Patient is agreeable with this plan, no questions  Final Clinical Impressions(s) / UC Diagnoses   Final diagnoses:  Wound of left lower extremity, initial encounter  Cellulitis of left lower extremity     Discharge Instructions      Doxycycline  antibiotic. Twice daily for 7 days in a row.  Please finish all of medication, you should not have any leftover pills. It can take about 3 days to start working for infection. Keep the area clean with soap and water .  Please do not use any peroxide or alcohol  as this can interfere with wound healing.  Return with any new symptoms or concerns     ED Prescriptions     Medication Sig Dispense Auth. Provider   doxycycline  (VIBRAMYCIN ) 100 MG capsule Take 1 capsule (100 mg total) by mouth 2 (two) times daily for 7 days. 14 capsule Eurika Sandy, [place], PA-C      PDMP not reviewed this encounter.   Tesla Bochicchio, [place], [place] 02/02/24 1018
